# Patient Record
Sex: Female | Born: 1990 | Hispanic: Yes | Marital: Single | Smoking: Never smoker
Health system: Southern US, Community
[De-identification: ages and names within clinical notes are randomized; demographics above are authoritative.]

## PROBLEM LIST (undated history)

## (undated) DIAGNOSIS — E282 Polycystic ovarian syndrome: Secondary | ICD-10-CM

## (undated) HISTORY — DX: Polycystic ovarian syndrome: E28.2

---

## 2014-03-04 ENCOUNTER — Emergency Department (HOSPITAL_COMMUNITY)
Admission: EM | Admit: 2014-03-04 | Discharge: 2014-03-05 | Disposition: A | Discharge status: Home / Self Care | Payer: No Typology Code available for payment source | Attending: Emergency Medicine | Admitting: Emergency Medicine

## 2014-03-04 ENCOUNTER — Encounter (HOSPITAL_COMMUNITY): Payer: Self-pay | Admitting: Emergency Medicine

## 2014-03-04 ENCOUNTER — Emergency Department (HOSPITAL_COMMUNITY): Payer: No Typology Code available for payment source

## 2014-03-04 DIAGNOSIS — S9000XA Contusion of unspecified ankle, initial encounter: Secondary | ICD-10-CM | POA: Insufficient documentation

## 2014-03-04 DIAGNOSIS — S9031XA Contusion of right foot, initial encounter: Secondary | ICD-10-CM

## 2014-03-04 DIAGNOSIS — Y9389 Activity, other specified: Secondary | ICD-10-CM | POA: Insufficient documentation

## 2014-03-04 DIAGNOSIS — S99919A Unspecified injury of unspecified ankle, initial encounter: Secondary | ICD-10-CM

## 2014-03-04 DIAGNOSIS — S93401A Sprain of unspecified ligament of right ankle, initial encounter: Secondary | ICD-10-CM

## 2014-03-04 DIAGNOSIS — S8990XA Unspecified injury of unspecified lower leg, initial encounter: Secondary | ICD-10-CM | POA: Insufficient documentation

## 2014-03-04 DIAGNOSIS — Y92410 Unspecified street and highway as the place of occurrence of the external cause: Secondary | ICD-10-CM | POA: Insufficient documentation

## 2014-03-04 DIAGNOSIS — S9030XA Contusion of unspecified foot, initial encounter: Secondary | ICD-10-CM | POA: Insufficient documentation

## 2014-03-04 DIAGNOSIS — S99929A Unspecified injury of unspecified foot, initial encounter: Secondary | ICD-10-CM

## 2014-03-04 DIAGNOSIS — S93409A Sprain of unspecified ligament of unspecified ankle, initial encounter: Secondary | ICD-10-CM | POA: Insufficient documentation

## 2014-03-04 MED ORDER — IBUPROFEN 400 MG PO TABS
400.0000 mg | ORAL_TABLET | Freq: Once | ORAL | Status: AC
  Administered 2014-03-04: 400 mg via ORAL
  Filled 2014-03-04: qty 1
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
## 2014-03-05 MED ORDER — IBUPROFEN 800 MG PO TABS: 800.0000 mg | ORAL_TABLET | Freq: Three times a day (TID) | ORAL | Status: DC

## 2014-03-05 NOTE — Discharge Instructions (Signed)
1. Medications: ibuprofen, usual home medications 2. Treatment: rest, drink plenty of fluids, use ankle brace 3. Follow Up: Please followup with your primary doctor or orthopedics in 1 week for discussion of your diagnoses and further evaluation after today's visit; if you do not have a primary care doctor use the resource guide provided to find one;     Ankle Sprain An ankle sprain is an injury to the strong, fibrous tissues (ligaments) that hold the bones of your ankle joint together.  CAUSES An ankle sprain is usually caused by a fall or by twisting your ankle. Ankle sprains most commonly occur when you step on the outer edge of your foot, and your ankle turns inward. People who participate in sports are more prone to these types of injuries.  SYMPTOMS   Pain in your ankle. The pain may be present at rest or only when you are trying to stand or walk.  Swelling.  Bruising. Bruising may develop immediately or within 1 to 2 days after your injury.  Difficulty standing or walking, particularly when turning corners or changing directions. DIAGNOSIS  Your caregiver will ask you details about your injury and perform a physical exam of your ankle to determine if you have an ankle sprain. During the physical exam, your caregiver will press on and apply pressure to specific areas of your foot and ankle. Your caregiver will try to move your ankle in certain ways. An X-ray exam may be done to be sure a bone was not broken or a ligament did not separate from one of the bones in your ankle (avulsion fracture).  TREATMENT  Certain types of braces can help stabilize your ankle. Your caregiver can make a recommendation for this. Your caregiver may recommend the use of medicine for pain. If your sprain is severe, your caregiver may refer you to a surgeon who helps to restore function to parts of your skeletal system (orthopedist) or a physical therapist. HOME CARE INSTRUCTIONS   Apply ice to your injury  for 1-2 days or as directed by your caregiver. Applying ice helps to reduce inflammation and pain.  Put ice in a plastic bag.  Place a towel between your skin and the bag.  Leave the ice on for 15-20 minutes at a time, every 2 hours while you are awake.  Only take over-the-counter or prescription medicines for pain, discomfort, or fever as directed by your caregiver.  Elevate your injured ankle above the level of your heart as much as possible for 2-3 days.  If your caregiver recommends crutches, use them as instructed. Gradually put weight on the affected ankle. Continue to use crutches or a cane until you can walk without feeling pain in your ankle.  If you have a plaster splint, wear the splint as directed by your caregiver. Do not rest it on anything harder than a pillow for the first 24 hours. Do not put weight on it. Do not get it wet. You may take it off to take a shower or bath.  You may have been given an elastic bandage to wear around your ankle to provide support. If the elastic bandage is too tight (you have numbness or tingling in your foot or your foot becomes cold and blue), adjust the bandage to make it comfortable.  If you have an air splint, you may blow more air into it or let air out to make it more comfortable. You may take your splint off at night and before taking a  shower or bath. Wiggle your toes in the splint several times per day to decrease swelling. SEEK MEDICAL CARE IF:   You have rapidly increasing bruising or swelling.  Your toes feel extremely cold or you lose feeling in your foot.  Your pain is not relieved with medicine. SEEK IMMEDIATE MEDICAL CARE IF:  Your toes are numb or blue.  You have severe pain that is increasing. MAKE SURE YOU:   Understand these instructions.  Will watch your condition.  Will get help right away if you are not doing well or get worse. Document Released: 07/25/2005 Document Revised: 04/18/2012 Document Reviewed:  08/06/2011 [facility] Patient Information 2015 [place], [place]. This information is not intended to replace advice given to you by your health care provider. Make sure you discuss any questions you have with your health care provider.  Contusion A contusion is a deep bruise. Contusions are the result of an injury that caused bleeding under the skin. The contusion may turn blue, purple, or yellow. Minor injuries will give you a painless contusion, but more severe contusions may stay painful and swollen for a few weeks.  CAUSES  A contusion is usually caused by a blow, trauma, or direct force to an area of the body. SYMPTOMS   Swelling and redness of the injured area.  Bruising of the injured area.  Tenderness and soreness of the injured area.  Pain. DIAGNOSIS  The diagnosis can be made by taking a history and physical exam. An X-ray, CT scan, or MRI may be needed to determine if there were any associated injuries, such as fractures. TREATMENT  Specific treatment will depend on what area of the body was injured. In general, the best treatment for a contusion is resting, icing, elevating, and applying cold compresses to the injured area. Over-the-counter medicines may also be recommended for pain control. Ask your caregiver what the best treatment is for your contusion. HOME CARE INSTRUCTIONS   Put ice on the injured area.  Put ice in a plastic bag.  Place a towel between your skin and the bag.  Leave the ice on for 15-20 minutes, 3-4 times a day, or as directed by your health care provider.  Only take over-the-counter or prescription medicines for pain, discomfort, or fever as directed by your caregiver. Your caregiver may recommend avoiding anti-inflammatory medicines (aspirin, ibuprofen, and naproxen) for 48 hours because these medicines may increase bruising.  Rest the injured area.  If possible, elevate the injured area to reduce swelling. SEEK IMMEDIATE MEDICAL CARE IF:   You  have increased bruising or swelling.  You have pain that is getting worse.  Your swelling or pain is not relieved with medicines. MAKE SURE YOU:   Understand these instructions.  Will watch your condition.  Will get help right away if you are not doing well or get worse. Document Released: 05/04/2005 Document Revised: 07/30/2013 Document Reviewed: 05/30/2011 [facility] Patient Information 2015 [place], [place]. This information is not intended to replace advice given to you by your health care provider. Make sure you discuss any questions you have with your health care provider.    Emergency Department Resource Guide 1) Find a Doctor and Pay Out of Pocket Although you won't have to find out who is covered by your insurance plan, it is a good idea to ask around and get recommendations. You will then need to call the office and see if the doctor you have chosen will accept you as a new patient and what types of  options they offer for patients who are self-pay. Some doctors offer discounts or will set up payment plans for their patients who do not have insurance, but you will need to ask so you aren't surprised when you get to your appointment.  2) Contact Your Local Health Department Not all health departments have doctors that can see patients for sick visits, but many do, so it is worth a call to see if yours does. If you don't know where your local health department is, you can check in your phone book. The CDC also has a tool to help you locate your state's health department, and many state websites also have listings of all of their local health departments.  3) Find a Walk-in Clinic If your illness is not likely to be very severe or complicated, you may want to try a walk in clinic. These are popping up all over the country in pharmacies, drugstores, and shopping centers. They're usually staffed by nurse practitioners or physician assistants that have been trained to treat common illnesses  and complaints. They're usually fairly quick and inexpensive. However, if you have serious medical issues or chronic medical problems, these are probably not your best option.  No Primary Care Doctor: - Call Health Connect at  [phone] - they can help you locate a primary care doctor that  accepts your insurance, provides certain services, etc. - Physician Referral Service- [phone]  Chronic Pain Problems: Organization         Address  Phone   Notes  [facility]  [phone] Patients need to be referred by their primary care doctor.   Medication Assistance: Organization         Address  Phone   Notes  [facility] Medication [facility] [address] [place], [place] [address] [phone] --Must be a resident of [facility] [place] -- Must have NO insurance coverage whatsoever (no Medicaid/ Medicare, etc.) -- The pt. MUST have a primary care doctor that directs their care regularly and follows them in the community   MedAssist  [phone]   [employer]  [phone]    Agencies that provide inexpensive medical care: Organization         Address  Phone   Notes  [facility]  [phone]   [facility]    [phone]   [facility] [address] [place], [place] [address] [phone]   [facility] [place] [address], [place] [phone]   [facility]anned Parenthood    [phone]) [phone]   [facility]    [phone]   [facility]  [address], [place] Phone:  [phone]) [phone], Fax:  [phone]) [phone] Hours of Operation:  9 am - 6 pm, M-F.  Also accepts Medicaid/Medicare and self-pay.  [facility]  [address], [place] Phone: [phone], Fax: [phone]) [phone]. Hours of Operation:  8:30 am - 5:30 pm, M-F.  Also accepts Medicaid and self-pay.  [facility] [place] [address], [place] Phone: [phone]   [facility] [address], [place], [place] [phone], Ext. [phone] Mondays & Thursdays: 7-9 AM.  First 15 patients are seen on a first come, first serve basis.    Medicaid-accepting [facility] Providers:  Organization         Address  Phone   Notes  [facility] [address], [place] [phone] Also accepts self-pay patients.  [facility] [id number] [place], [place]  [phone]   [place], [address], [place] [phone]   [facility] [address], [place] [phone]   Lucianne Lei [address], [place]   [phone] Only accepts [place] Access [place] patients after they have their name applied to their card.   Self-Pay (no insurance) in [facility]:  Organization         Address  Phone   Notes  Sickle Cell Patients, [facility] [place] [phone]   [facility] [place] [phone]   [facility] [place]  [place], [place], [place] [phone]   [facility]/Dr. Osei-Bonsu  [address] [place] or [address], [place] [phone] Phone number for both [place] and [place] locations is the same.  [facility] [address] [place] [phone]   [facility] [address], [place] or [address] [phone]   [facility] [address], [place] [phone], phone; [phone], fax Sees patients 1st and 3rd Saturday of every month.  Must not qualify for public or private insurance (i.e. Medicaid, Medicare, [place] Health Choice, Veterans' Benefits)  Household income should be no more than 200% of the poverty level  The clinic cannot treat you if you are pregnant or think you are pregnant  Sexually transmitted diseases are not treated at the clinic.    Dental Care: Organization         Address  Phone  Notes  [facility] of [facility] [phone] Accepts children up to age 92 who are enrolled in [place] or [place]; pregnant women with a Medicaid card; and children who have applied for Medicaid or [place] Health Choice, but were declined, whose parents can pay a reduced fee at time of service.  [facility] of [facility]  [address], [place] [phone] Accepts children up to age 72 who are enrolled in [place] or [place]; pregnant women with a Medicaid card; and children who have applied for Medicaid or [place] Health Choice, but were declined, whose parents can pay a reduced fee at time of service.  [place] [facility] Adult [facility] Access PROGRAM  [place] [phone] Patients are seen by appointment only. Walk-ins are not accepted. [place] will see patients 5 years of age and older. Monday - Tuesday (8am-5pm) Most Wednesdays (8:30-5pm) $30 per visit, cash only  [facility] Adult Dental Access PROGRAM  [address], [facility] [phone] Patients are seen by appointment only. Walk-ins are not accepted. [place] will see patients 15 years of age and older. One Wednesday Evening (Monthly: Volunteer Based).  $30 per visit, cash only  [place]  [phone] for adults; Children under age 21, call Graduate Pediatric Dentistry at [phone]. Children aged 70-14, please call [phone] to request a pediatric application.  Dental services are provided in all areas of dental care including fillings, crowns and bridges, complete and partial dentures,  implants, gum treatment, root canals, and extractions. Preventive care is  also provided. Treatment is provided to both adults and children. Patients are selected via a lottery and there is often a waiting list.   [facility] [address], [place]  [phone] [email or web address]   [facility] [address] [place], [place] [phone], Ext. [phone] Second and Fourth Thursday of each month, opens at 6:30 AM; Clinic ends at 9 AM.  Patients are seen on a first-come first-served basis, and a limited number are seen during each clinic.   [facility]  [address] Hillard Danker [place], [place] [phone]   Eligibility Requirements You must have lived in [place], [place], or [place] counties for at least the last three months.   You cannot be eligible for state or federal sponsored [employer], including [employer], [place], or [employer].   You generally cannot be eligible for healthcare insurance through your employer.    How to apply: Eligibility screenings are held every Tuesday and Wednesday afternoon from 1:00 pm until 4:00 pm. You do not need an appointment for the interview!  [facility] [address] [place], [place]   [place]  [place] Department  [place]  [phone]    Behavioral Health Resources in the Community: Intensive Outpatient Programs Organization         Address  Phone  Notes  [place]. [address] [place], [place] [phone]   [facility] Outpatient [address] [place], [place]   ADS: Alcohol [facility] Drug S[facility]cs [address] [place], [place]   [place] [address]  [place], [place] or [phone]   Substance Abuse Resources Organization         Address  Phone  Notes  Alcohol and Drug Services  [phone]   [place]  [phone]   The [place]   Chinita Pester  [phone]   Residential & Outpatient Substance Abuse Program  [phone]   Psychological Services Organization         Address  Phone  Notes  [facility] [place]  [place]  [phone]   [place] [address] [place] or [phone]    Mobile Crisis Teams Organization         Address  Phone  Notes  [facility], Mobile Crisis Care Unit  [phone]   [facility] Psychotherapeutic Services  [address] [place], [place]   [place] Levels [address] [place] [phone]    Self-Help/Support Groups Organization         Address  Phone             Notes  [place]. of [place] - variety of support groups  [place] Call for more information  Narcotics Anonymous (NA), [facility] [address], [employer]  2 meetings at this location   Special educational needs teacher         Address  Phone  Notes  ASAP Residential Treatment [place],    [place]  [phone]   [facility]  [address] [place] [phone], [place], [place]   [place], [place] [phone] Admissions: 8am-3pm M-F  Incentives  Substance [place] [address],    [place], [place] [phone]   [facility] [address] [place], [place], [place]   [facility] [address],  [place], [place]   [facility] Programs - Intensive Outpatient [address], [place], [place]   [facility] ([place].,  [place], [place] [phone] or [phone]   [facility] Services (RTS) [address], [place], [place] Accepts Medicaid  [facility] [place] [address],  [place] [phone]  Substance Abuse/Addiction Treatment   [facility] Organization         Address  Phone  Notes  [facility]  [phone]   Domenic Schwab, PhD [address] Arlis Porta [place], [place]   [phone] or [phone]   [place] Angelina [place], [place] [phone]   [facility] [address], [place], [place] [phone] Insurance/Medicaid/sponsorship through [facility] and Families [address] [place]                                    [place], [place] [phone] [place] [address], [place] [phone]    Dr. Adele Schilder  [phone]   [facility] Dept. 1) [address], [place] 2) [place] 3)  [place], [place] [phone]  [phone]   [place] [phone] or [phone] (After Hours)
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
## 2014-03-05 NOTE — ED Provider Notes (Signed)
Medical screening examination/treatment/procedure(s) were performed by non-physician practitioner and as supervising physician I was immediately available for consultation/collaboration.   EKG Interpretation None       Leeasia Secrist K Charlesetta Milliron-Rasch, MD 03/05/14 0214 

## 2014-07-05 ENCOUNTER — Encounter (HOSPITAL_COMMUNITY): Payer: Self-pay | Admitting: *Deleted

## 2014-07-05 ENCOUNTER — Emergency Department (HOSPITAL_COMMUNITY)
Admission: EM | Admit: 2014-07-05 | Discharge: 2014-07-05 | Discharge status: Against Medical Advice | Payer: No Typology Code available for payment source | Attending: Emergency Medicine | Admitting: Emergency Medicine

## 2014-07-05 DIAGNOSIS — R51 Headache: Secondary | ICD-10-CM | POA: Insufficient documentation

## 2014-07-05 NOTE — ED Notes (Signed)
Pt in c/o headache for the last four days, states pain wasn't bad the first few days but last night it increased, pt vomited x1 today, history of headaches but they do not normally last this long, alert and oriented, no distress noted

## 2015-05-11 ENCOUNTER — Encounter: Payer: Self-pay | Admitting: Family Medicine

## 2015-05-11 ENCOUNTER — Ambulatory Visit (INDEPENDENT_AMBULATORY_CARE_PROVIDER_SITE_OTHER): Payer: Managed Care, Other (non HMO) | Admitting: Family Medicine

## 2015-05-11 VITALS — BP 132/88 | HR 105 | Temp 98.4°F | Resp 16 | Ht 62.0 in | Wt 308.0 lb

## 2015-05-11 DIAGNOSIS — Z7189 Other specified counseling: Secondary | ICD-10-CM

## 2015-05-11 DIAGNOSIS — L0293 Carbuncle, unspecified: Secondary | ICD-10-CM | POA: Diagnosis not present

## 2015-05-11 DIAGNOSIS — Z23 Encounter for immunization: Secondary | ICD-10-CM | POA: Diagnosis not present

## 2015-05-11 DIAGNOSIS — Z7689 Persons encountering health services in other specified circumstances: Secondary | ICD-10-CM

## 2015-05-11 LAB — COMPLETE METABOLIC PANEL WITH GFR
ALT: 13 U/L (ref 6–29)
AST: 11 U/L (ref 10–30)
Albumin: 3.8 g/dL (ref 3.6–5.1)
Alkaline Phosphatase: 86 U/L (ref 33–115)
BUN: 10 mg/dL (ref 7–25)
CO2: 26 mmol/L (ref 20–31)
Calcium: 9.2 mg/dL (ref 8.6–10.2)
Chloride: 102 mmol/L (ref 98–110)
Creat: 0.55 mg/dL (ref 0.50–1.10)
GFR, Est African American: >89 mL/min (ref >=60)
GLUCOSE: 92 mg/dL (ref 65–99)
POTASSIUM: 4.3 mmol/L (ref 3.5–5.3)
Sodium: 136 mmol/L (ref 135–146)
Total Bilirubin: 0.2 mg/dL (ref 0.2–1.2)
Total Protein: 7.8 g/dL (ref 6.1–8.1)

## 2015-05-11 LAB — CBC WITH DIFFERENTIAL/PLATELET
Basophils Absolute: 0 10*3/uL (ref 0.0–0.1)
Basophils Relative: 0 % (ref 0–1)
Eosinophils Absolute: 0.2 10*3/uL (ref 0.0–0.7)
Eosinophils Relative: 2 % (ref 0–5)
HCT: 35.6 % — ABNORMAL LOW (ref 36.0–46.0)
HEMOGLOBIN: 11.8 g/dL — AB (ref 12.0–15.0)
LYMPHS ABS: 2.4 10*3/uL (ref 0.7–4.0)
Lymphocytes Relative: 31 % (ref 12–46)
MCH: 25.9 pg — AB (ref 26.0–34.0)
MCHC: 33.1 g/dL (ref 30.0–36.0)
MCV: 78.2 fL (ref 78.0–100.0)
MPV: 9.3 fL (ref 8.6–12.4)
Monocytes Absolute: 0.4 10*3/uL (ref 0.1–1.0)
Monocytes Relative: 5 % (ref 3–12)
NEUTROS PCT: 62 % (ref 43–77)
Neutro Abs: 4.8 10*3/uL (ref 1.7–7.7)
Platelets: 361 10*3/uL (ref 150–400)
RBC: 4.55 MIL/uL (ref 3.87–5.11)
RDW: 14.8 % (ref 11.5–15.5)
WBC: 7.8 10*3/uL (ref 4.0–10.5)

## 2015-05-11 LAB — TSH: TSH: 2.331 u[IU]/mL (ref 0.350–4.500)

## 2015-05-11 MED ORDER — SULFAMETHOXAZOLE-TRIMETHOPRIM 800-160 MG PO TABS: 1.0000 | ORAL_TABLET | Freq: Two times a day (BID) | ORAL | Status: DC

## 2015-05-11 NOTE — Patient Instructions (Signed)

## 2015-05-11 NOTE — Progress Notes (Signed)
Patient ID: Carolyn Gay, female   DOB: 07-05-1991, 24 y.o.   MRN: [id number]   Carolyn Gay, is a 24 y.o. female  WUJ:[id number]  NFA:[id number]  DOB - August 17, 1990  CC:  Chief Complaint  Patient presents with  . Establish Care    problems with abcess under arms and on bra line        HPI: Carolyn Gay is a 24 y.o. female here to establish care. Her medical history is unremarkable except for a history of PCOS, frequent boils in axilla and morbid obesity. She is on no regular medications at this time. She does a request a prescription for an antibiotic to have on hand in case of abscess formation. She is in need of a Tdap, influenza and a PAP smear. She does complaint today of a two week history of upper respiratory congestion and cough, which is improving.  She denies, tobacco, alcohol or illicit drug use. She admits to not following a particularly healthy diet and does not exercise regularly.   No Known Allergies Past Medical History  Diagnosis Date  . PCOS (polycystic ovarian syndrome)     at age 73    Current Outpatient Prescriptions on File Prior to Visit  Medication Sig Dispense Refill  . ibuprofen (ADVIL,MOTRIN) 800 MG tablet Take 1 tablet (800 mg total) by mouth 3 (three) times daily. 21 tablet 0   No current facility-administered medications on file prior to visit.   Family History  Problem Relation Age of Onset  . Diabetes Mother   . Cancer Maternal Grandmother     breast   Social History   Social History  . Marital Status: Single    Spouse Name: N/A  . Number of Children: N/A  . Years of Education: N/A   Occupational History  . Not on file.   Social History Main Topics  . Smoking status: Never Smoker   . Smokeless tobacco: Never Used  . Alcohol Use: No  . Drug Use: No  . Sexual Activity: No   Other Topics Concern  . Not on file   Social History Narrative    Review of Systems: Constitutional: Negative for fever, chills, appetite change, weight loss,   Fatigue. Skin: Negative for rashes or lesions of concern.Positive for frequent boils in axilla, and trunk. None present now. HENT: Negative for ear pain, ear discharge.nose bleeds. Has had recent nasal congestion which is improving Eyes: Negative for pain, discharge, redness, itching and visual disturbance. Neck: Negative for pain, stiffness Respiratory: Negative for shortness of breath. Positive for recent cough which is improving. Cardiovascular: Negative for chest pain, palpitations and leg swelling. Gastrointestinal: Negative for abdominal pain, nausea, vomiting, diarrhea, constipations Genitourinary: Negative for dysuria, urgency, frequency, hematuria,  Musculoskeletal: Negative for back pain, joint pain, joint  swelling, and gait problem.Negative for weakness. Neurological: Negative for dizziness, tremors, seizures, syncope,   light-headedness, numbness and headaches.  Hematological: Negative for easy bruising or bleeding Psychiatric/Behavioral: Negative for depression, anxiety, decreased concentration, confusion GYN: Denies current problems with periods. Is not sexually active and has had sex once about 2 years ago.    Objective:   Filed Vitals:   05/11/15 1128  BP: 132/88  Pulse:   Temp:   Resp:     Physical Exam: Constitutional: Patient appears well-developed and well-nourished. No distress.. Morbidly obese. HENT: Normocephalic, atraumatic, External right and left ear normal. Oropharynx is clear and moist.  Eyes: Conjunctivae and EOM are normal. PERRLA, no scleral icterus. Neck: Normal ROM. Neck supple. No  lymphadenopathy, No thyromegaly. CVS: RRR, S1/S2 +, no murmurs, no gallops, no rubs Pulmonary: Effort and breath sounds normal, no stridor, rhonchi, wheezes, rales.  Abdominal: Soft. Normoactive BS,, no distension, tenderness, rebound or guarding. Obese. Musculoskeletal: Normal range of motion. No edema and no tenderness.  Neuro: Alert.Normal muscle tone coordination.  Non-focal Skin: Skin is warm and dry. No rash noted. Not diaphoretic. No erythema. No pallor.There are multiple scars in the axilla from previous abscesses.  Psychiatric: Normal mood and affect. Behavior, judgment, thought content normal.  No results found for: WBC, HGB, HCT, MCV, PLT No results found for: CREATININE, BUN, NA, K, CL, CO2  No results found for: HGBA1C Lipid Panel  No results found for: CHOL, TRIG, HDL, CHOLHDL, VLDL, LDLCALC     Assessment and plan:   1. Encounter to establish care - I have reviewed information provided by the patient  - COMPLETE METABOLIC PANEL WITH GFR - CBC with Differential - TSH - HIV antibody (with reflex)  2.Need for prophylactic vaccination and inoculation against influenza  - Flu Vaccine QUAD 36+ mos PF IM (Fluarix & Fluzone Quad PF)  3. Need for Tdap vaccination - Tdap  4. Morbid obesity, unspecified obesity type (HCC) - Discussed the many reasons to attempt to lose weight and provided printed information  5. 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 0 refills      Return in about 2 months (around 07/11/2015). for PAP smear.  The patient was given clear instructions to go to ER or return to medical center if symptoms don't improve, worsen or new problems develop. The patient verbalized understanding.    Henrietta Hoover FNP  05/11/2015, 11:34 AM

## 2015-05-12 LAB — HIV ANTIBODY (ROUTINE TESTING W REFLEX): HIV 1&2 Ab, 4th Generation: NONREACTIVE

## 2015-06-08 ENCOUNTER — Ambulatory Visit (INDEPENDENT_AMBULATORY_CARE_PROVIDER_SITE_OTHER): Payer: Managed Care, Other (non HMO) | Admitting: Family Medicine

## 2015-06-08 VITALS — BP 119/86 | HR 119 | Temp 98.4°F | Resp 16 | Ht 62.0 in | Wt 307.0 lb

## 2015-06-08 DIAGNOSIS — M25569 Pain in unspecified knee: Secondary | ICD-10-CM | POA: Diagnosis not present

## 2015-06-08 NOTE — Patient Instructions (Signed)
Generic Knee Exercises EXERCISES RANGE OF MOTION (ROM) AND STRETCHING EXERCISES These exercises may help you when beginning to rehabilitate your injury. Your symptoms may resolve with or without further involvement from your physician, physical therapist, or athletic trainer. While completing these exercises, remember:   Restoring tissue flexibility helps normal motion to return to the joints. This allows healthier, less painful movement and activity.  An effective stretch should be held for at least 30 seconds.  A stretch should never be painful. You should only feel a gentle lengthening or release in the stretched tissue. STRETCH - Knee Extension, Prone  Lie on your stomach on a firm surface, such as a bed or countertop. Place your right / left knee and leg just beyond the edge of the surface. You may wish to place a towel under the far end of your right / left thigh for comfort.  Relax your leg muscles and allow gravity to straighten your knee. Your clinician may advise you to add an ankle weight if more resistance is helpful for you.  You should feel a stretch in the back of your right / left knee. Hold this position for ____30______ seconds. Repeat ____10______ times. Complete this stretch ____10______ times per day. * Your physician, physical therapist, or athletic trainer may ask you to add ankle weight to enhance your stretch.  RANGE OF MOTION - Knee Flexion, Active  Lie on your back with both knees straight. (If this causes back discomfort, bend your opposite knee, placing your foot flat on the floor.)  Slowly slide your heel back toward your buttocks until you feel a gentle stretch in the front of your knee or thigh.  Hold for ____30______ seconds. Slowly slide your heel back to the starting position. Repeat __________ times. Complete this exercise __________ times per day.  STRETCH - Quadriceps, Prone   Lie on your stomach on a firm surface, such as a bed or padded  floor.  Bend your right / left knee and grasp your ankle. If you are unable to reach your ankle or pant leg, use a belt around your foot to lengthen your reach.  Gently pull your heel toward your buttocks. Your knee should not slide out to the side. You should feel a stretch in the front of your thigh and/or knee.  Hold this position for __________ seconds. Repeat __________ times. Complete this stretch __________ times per day.  STRETCH - Hamstrings, Supine   Lie on your back. Loop a belt or towel over the ball of your right / left foot.  Straighten your right / left knee and slowly pull on the belt to raise your leg. Do not allow the right / left knee to bend. Keep your opposite leg flat on the floor.  Raise the leg until you feel a gentle stretch behind your right / left knee or thigh. Hold this position for __________ seconds. Repeat __________ times. Complete this stretch __________ times per day.  STRENGTHENING EXERCISES These exercises may help you when beginning to rehabilitate your injury. They may resolve your symptoms with or without further involvement from your physician, physical therapist, or athletic trainer. While completing these exercises, remember:   Muscles can gain both the endurance and the strength needed for everyday activities through controlled exercises.  Complete these exercises as instructed by your physician, physical therapist, or athletic trainer. Progress the resistance and repetitions only as guided.  You may experience muscle soreness or fatigue, but the pain or discomfort you are trying to  eliminate should never worsen during these exercises. If this pain does worsen, stop and make certain you are following the directions exactly. If the pain is still present after adjustments, discontinue the exercise until you can discuss the trouble with your clinician. STRENGTH - Quadriceps, Isometrics  Lie on your back with your right / left leg extended and your  opposite knee bent.  Gradually tense the muscles in the front of your right / left thigh. You should see either your knee cap slide up toward your hip or increased dimpling just above the knee. This motion will push the back of the knee down toward the floor/mat/bed on which you are lying.  Hold the muscle as tight as you can without increasing your pain for __________ seconds.  Relax the muscles slowly and completely in between each repetition. Repeat __________ times. Complete this exercise __________ times per day.  STRENGTH - Quadriceps, Short Arcs   Lie on your back. Place a __________ inch towel roll under your knee so that the knee slightly bends.  Raise only your lower leg by tightening the muscles in the front of your thigh. Do not allow your thigh to rise.  Hold this position for __________ seconds. Repeat __________ times. Complete this exercise __________ times per day.  OPTIONAL ANKLE WEIGHTS: Begin with ____________________, but DO NOT exceed ____________________. Increase in 1 pound/0.5 kilogram increments.  STRENGTH - Quadriceps, Straight Leg Raises  Quality counts! Watch for signs that the quadriceps muscle is working to insure you are strengthening the correct muscles and not "cheating" by substituting with healthier muscles.  Lay on your back with your right / left leg extended and your opposite knee bent.  Tense the muscles in the front of your right / left thigh. You should see either your knee cap slide up or increased dimpling just above the knee. Your thigh may even quiver.  Tighten these muscles even more and raise your leg 4 to 6 inches off the floor. Hold for __________ seconds.  Keeping these muscles tense, lower your leg.  Relax the muscles slowly and completely in between each repetition. Repeat __________ times. Complete this exercise __________ times per day.  STRENGTH - Hamstring, Curls  Lay on your stomach with your legs extended. (If you lay on a  bed, your feet may hang over the edge.)  Tighten the muscles in the back of your thigh to bend your right / left knee up to 90 degrees. Keep your hips flat on the bed/floor.  Hold this position for __________ seconds.  Slowly lower your leg back to the starting position. Repeat __________ times. Complete this exercise __________ times per day.  OPTIONAL ANKLE WEIGHTS: Begin with ____________________, but DO NOT exceed ____________________. Increase in 1 pound/0.5 kilogram increments.  STRENGTH - Quadriceps, Squats  Stand in a door frame so that your feet and knees are in line with the frame.  Use your hands for balance, not support, on the frame.  Slowly lower your weight, bending at the hips and knees. Keep your lower legs upright so that they are parallel with the door frame. Squat only within the range that does not increase your knee pain. Never let your hips drop below your knees.  Slowly return upright, pushing with your legs, not pulling with your hands. Repeat __________ times. Complete this exercise __________ times per day.  STRENGTH - Quadriceps, Wall Slides  Follow guidelines for form closely. Increased knee pain often results from poorly placed feet or knees.  Lean against a smooth wall or door and walk your feet out 18-24 inches. Place your feet hip-width apart.  Slowly slide down the wall or door until your knees bend __________ degrees.* Keep your knees over your heels, not your toes, and in line with your hips, not falling to either side.  Hold for __________ seconds. Stand up to rest for __________ seconds in between each repetition. Repeat __________ times. Complete this exercise __________ times per day. * Your physician, physical therapist, or athletic trainer will alter this angle based on your symptoms and progress.  Apply heat to your knee for 20 minutes every nigh. You may use ibuprofen if needed for pain. This information is not intended to replace advice  given to you by your health care provider. Make sure you discuss any questions you have with your health care provider.   Document Released: 06/08/2005 Document Revised: 08/15/2014 Document Reviewed: 11/06/2008 [employer] Interactive Patient Education [employer]

## 2015-06-09 NOTE — Progress Notes (Signed)
Patient ID: Carolyn Gay, female   DOB: 07/21/1991, 24 y.o.   MRN: [id number]   Carolyn Gay, is a 24 y.o. female  [id number]    DOB - 04/12/1991  CC:  Chief Complaint  Patient presents with  . Knee Pain    right knee ---wants note for [employer]cool         HPI: Carolyn Gay is a [age] year/old female who presents requesting a note for school to allow her to exercise in a way that does not hurt her knee, mostly kneeling. She twisted her knee a couple of weeks ago and is better. Is walking and bending her knee without problem but there is significant pain if she has to kneel on the gym floor to assist someone else with situps or to do pushups.  She denies anyone complaints today.  She denies chest pain, shortness of breath, dizziness, headaches. She denies gait difficulty. She recognizes that her weight is very much aggravating this problem        No Known Allergies  Past Medical History  Diagnosis Date  . PCOS (polycystic ovarian syndrome)     at age 24    Dewayne's family history includes Cancer in her maternal grandmother; Diabetes in her mother.  Social History   Social History  . Marital Status: Single    Spouse Name: N/A  . Number of Children: N/A  . Years of Education: N/A   Occupational History  . Not on file.   Social History Main Topics  . Smoking status: Never Smoker   . Smokeless tobacco: Never Used  . Alcohol Use: No  . Drug Use: No  . Sexual Activity: No   Other Topics Concern  . Not on file   Social History Narrative   No past surgical history on file.     ROS:  GEN:   Denies fever, chills,WT loss, loss of appetite Skin:   Denies lesions or rashes HENT:   Denies  earache, epistaxis, sore throat, or neck pain, or headaches EYES:   Denies eye pain or drainage.               LUNGS:  Denies SOB with rest or walking; couging, choking CV:   Denies CP or palpitations ABD:   Denies abdominal pain, nausea,and vomiting, diarrhea  GU:   Denies frequency, urgency,  dysuria          EXT:    Denies muscle spasms or swelling. Positive for pain right knee,no unilateral weakness  NEURO:   Denies numbness or tingling, denies seizures  Objective:  Filed Vitals:   06/08/15 1402  BP: 119/86  Pulse: 119  Temp: 98.4 F (36.9 C)  TempSrc: Oral  Resp: 16  Height: 5\' 2"  (1.575 m)  Weight: 307 lb (139.254 kg)    Physical Exam:  General:   Well-developed, well-nourished, in no acute distress Skin:      Warm and dry, no significant rashes.     Head :    Normocephalic, atraumatic, no pallor, Eyes:      No icterus, drainage, PERL, EOMS intact Ears:      Clear bilaterally, TMs within normal limits. Nose:   nares patent w/o significant congestion or inflammation   Neck:      Supple FROM w/o adenopathy, tenderness, or thyroidomegaly No JVD  or tracheal diviation Heart:     Normal  RRR,without M,G,R Lungs:    Clear to auscultation bilaterally. No increase in respiratory effort.No wheezes, rales or stridor Abdomen:  Soft, nontender, nondistended, positive bowel sounds, no guarding or   rebound Exetremeties:  No pedal edema.FROM, 2+pedal pulses, No unilateral weakness.   Neuro:   Alert, oriented, appropriate, non-focal.  Pertinent Lab Results:  Lab Results  Component Value Date   WBC 7.8 05/11/2015   HGB 11.8* 05/11/2015   HCT 35.6* 05/11/2015   MCV 78.2 05/11/2015   PLT 361 05/11/2015     Chemistry      Component Value Date/Time   NA 136 05/11/2015 1129   K 4.3 05/11/2015 1129   CL 102 05/11/2015 1129   CO2 26 05/11/2015 1129   BUN 10 05/11/2015 1129   CREATININE 0.55 05/11/2015 1129      Component Value Date/Time   CALCIUM 9.2 05/11/2015 1129   ALKPHOS 86 05/11/2015 1129   AST 11 05/11/2015 1129   ALT 13 05/11/2015 1129   BILITOT 0.2 05/11/2015 1129      No results found for: HGBA1C  Medications: Prior to Admission medications   Medication Sig Start Date End Date Taking? Authorizing Provider  ibuprofen (ADVIL,MOTRIN) 800 MG tablet  Take 1 tablet (800 mg total) by mouth 3 (three) times daily. 03/05/14  Yes Hannah Muthersbaugh, PA-C  sulfamethoxazole-trimethoprim (BACTRIM DS,SEPTRA DS) 800-160 MG tablet Take 1 tablet by mouth 2 (two) times daily. Patient not taking: Reported on 06/08/2015 05/11/15   Henrietta Hoover, NP    Assessment/Plan  1. Knee pain -I have provided a note to gym teacher to allow her to modify her exercise regime to decrease stress on her knee -I have provided written knee exercise sheet to strengthen knee. -I have advised her to consider weight loss.  Follow up:PRN  The patient was given clear instructions to go to ER or return to medical center if symptoms don't improve, worsen or new problems develop. The patient verbalized understanding.     Henrietta Hoover, FNP,BC 06/09/2015, 11:00 AM

## 2015-07-13 ENCOUNTER — Ambulatory Visit: Payer: Managed Care, Other (non HMO) | Admitting: Family Medicine

## 2015-07-29 ENCOUNTER — Ambulatory Visit (INDEPENDENT_AMBULATORY_CARE_PROVIDER_SITE_OTHER): Payer: Managed Care, Other (non HMO) | Admitting: Family Medicine

## 2015-07-29 ENCOUNTER — Other Ambulatory Visit (HOSPITAL_COMMUNITY): Payer: Self-pay | Admitting: Family Medicine

## 2015-07-29 ENCOUNTER — Other Ambulatory Visit (HOSPITAL_COMMUNITY)
Admission: RE | Admit: 2015-07-29 | Discharge: 2015-07-29 | Disposition: A | Discharge status: Home / Self Care | Payer: Managed Care, Other (non HMO) | Source: Ambulatory Visit | Attending: Internal Medicine | Admitting: Internal Medicine

## 2015-07-29 VITALS — BP 126/66 | HR 83 | Temp 98.1°F | Resp 16 | Ht 62.0 in | Wt 304.0 lb

## 2015-07-29 DIAGNOSIS — Z124 Encounter for screening for malignant neoplasm of cervix: Secondary | ICD-10-CM

## 2015-07-29 DIAGNOSIS — Z01419 Encounter for gynecological examination (general) (routine) without abnormal findings: Secondary | ICD-10-CM | POA: Diagnosis present

## 2015-07-29 NOTE — Progress Notes (Signed)
Patient ID: Carolyn Gay, female   DOB: 10/14/1990, 24 y.o.   MRN: [id number]   Carolyn Gay 24 y/o female presents for collection of PAP smear. Other health maintenance issues were address at previous villages and she presents for collection of PAP smear not done at that visit.  She denies any other issues today.  Patient is morbidly obese, the vaginal canal is very deep and cervix was very difficult to visualize and specimen hard to collect.  I am not sure with 100 percent certainly that a cervical os specimen was collected. Unable to adequately perform a bimanual exam due to body habitus.

## 2015-07-30 LAB — CYTOLOGY - PAP

## 2016-01-01 ENCOUNTER — Ambulatory Visit (INDEPENDENT_AMBULATORY_CARE_PROVIDER_SITE_OTHER): Payer: Managed Care, Other (non HMO) | Admitting: Family Medicine

## 2016-01-01 VITALS — BP 132/80 | HR 82 | Temp 98.1°F | Resp 16 | Ht 62.0 in | Wt 295.0 lb

## 2016-01-01 DIAGNOSIS — B353 Tinea pedis: Secondary | ICD-10-CM | POA: Diagnosis not present

## 2016-01-01 MED ORDER — CLOTRIMAZOLE-BETAMETHASONE 1-0.05 % EX CREA: 1.0000 "application " | TOPICAL_CREAM | Freq: Two times a day (BID) | CUTANEOUS | Status: DC

## 2016-01-01 NOTE — Patient Instructions (Signed)
Use cream on feet twice a day for 2 weeks. Will put in referral to nutritionist if your insurance covers. Your thyroid function was normal in October.

## 2016-01-01 NOTE — Progress Notes (Signed)
Patient ID: Carolyn Gay, female   DOB: 09-26-1990, 25 y.o.   MRN: [id number]   Carolyn Gay, is a 25 y.o. female  [id number]  [id number]  DOB - 1991-02-10  CC:  Chief Complaint  Patient presents with  . Pruritis    inbetween toes . cracked skin on foot        HPI: Carolyn Gay is a 25 y.o. female with a itchy, scaley foot for a couple of months. She has used no OTC antifungals. She also complains of inability to lose weight, despite better diet and increased exercise.  No Known Allergies Past Medical History  Diagnosis Date  . PCOS (polycystic ovarian syndrome)     at age 4    Current Outpatient Prescriptions on File Prior to Visit  Medication Sig Dispense Refill  . ibuprofen (ADVIL,MOTRIN) 800 MG tablet Take 1 tablet (800 mg total) by mouth 3 (three) times daily. (Patient not taking: Reported on 07/29/2015) 21 tablet 0   No current facility-administered medications on file prior to visit.   Family History  Problem Relation Age of Onset  . Diabetes Mother   . Cancer Maternal Grandmother     breast   Social History   Social History  . Marital Status: Single    Spouse Name: N/A  . Number of Children: N/A  . Years of Education: N/A   Occupational History  . Not on file.   Social History Main Topics  . Smoking status: Never Smoker   . Smokeless tobacco: Never Used  . Alcohol Use: No  . Drug Use: No  . Sexual Activity: No   Other Topics Concern  . Not on file   Social History Narrative    Review of Systems: Constitutional: Negative for fever, chills, appetite change, weight loss,  Fatigue. Skin: Positive for itching, scaling left foot. HENT: Negative for ear pain, ear discharge.nose bleeds Eyes: Negative for pain, discharge, redness, itching and visual disturbance. Neck: Negative for pain, stiffness Respiratory: Negative for cough, shortness of breath,   Cardiovascular: Negative for chest pain, palpitations and leg swelling. Gastrointestinal: Negative for  abdominal pain, nausea, vomiting, diarrhea, constipations Genitourinary: Negative for dysuria, urgency, frequency, hematuria,  Musculoskeletal: Negative for back pain, joint pain, joint  swelling, and gait problem.Negative for weakness. Neurological: Negative for dizziness, tremors, seizures, syncope,   light-headedness, numbness and headaches.  Hematological: Negative for easy bruising or bleeding Psychiatric/Behavioral: Negative for depression, anxiety, decreased concentration, confusion   Objective:   Filed Vitals:   01/01/16 0805  BP: 132/80  Pulse: 82  Temp: 98.1 F (36.7 C)  Resp: 16    Physical Exam: Constitutional: Patient appears well-developed and well-nourished. No distress. HENT: Normocephalic, atraumatic, External right and left ear normal. Oropharynx is clear and moist.  Eyes: Conjunctivae and EOM are normal. PERRLA, no scleral icterus. Neck: Normal ROM. Neck supple. No lymphadenopathy, No thyromegaly. CVS: RRR, S1/S2 +, no murmurs, no gallops, no rubs Pulmonary: Effort and breath sounds normal, no stridor, rhonchi, wheezes, rales.  Abdominal: Soft. Normoactive BS,, no distension, tenderness, rebound or guarding.  Musculoskeletal: Normal range of motion. No edema and no tenderness.  Neuro: Alert.Normal muscle tone coordination. Non-focal Skin: Skin is warm and dry.Marland Kitchen No erythema. No pallor.Positive for some mild scaling of left foot. Psychiatric: Normal mood and affect. Behavior, judgment, thought content normal.  Lab Results  Component Value Date   WBC 7.8 05/11/2015   HGB 11.8* 05/11/2015   HCT 35.6* 05/11/2015   MCV 78.2 05/11/2015   PLT 361 05/11/2015  Lab Results  Component Value Date   CREATININE 0.55 05/11/2015   BUN 10 05/11/2015   NA 136 05/11/2015   K 4.3 05/11/2015   CL 102 05/11/2015   CO2 26 05/11/2015    No results found for: HGBA1C Lipid Panel  No results found for: CHOL, TRIG, HDL, CHOLHDL, VLDL, LDLCALC     Assessment and plan:    There are no diagnoses linked to this encounter.  Return if symptoms worsen or fail to improve.  The patient was given clear instructions to go to ER or return to medical center if symptoms don't improve, worsen or new problems develop. The patient verbalized understanding.    Carolyn HooverLinda C Lacosta Hargan FNP  01/01/2016, 8:23 AM

## 2016-09-22 ENCOUNTER — Ambulatory Visit (INDEPENDENT_AMBULATORY_CARE_PROVIDER_SITE_OTHER): Payer: Managed Care, Other (non HMO) | Admitting: Family Medicine

## 2016-09-22 ENCOUNTER — Encounter: Payer: Self-pay | Admitting: Family Medicine

## 2016-09-22 VITALS — BP 122/79 | Temp 98.9°F | Resp 16 | Ht 62.0 in | Wt 293.0 lb

## 2016-09-22 DIAGNOSIS — E8881 Metabolic syndrome: Secondary | ICD-10-CM | POA: Insufficient documentation

## 2016-09-22 DIAGNOSIS — L729 Follicular cyst of the skin and subcutaneous tissue, unspecified: Secondary | ICD-10-CM | POA: Diagnosis not present

## 2016-09-22 DIAGNOSIS — Z3009 Encounter for other general counseling and advice on contraception: Secondary | ICD-10-CM

## 2016-09-22 DIAGNOSIS — Z3041 Encounter for surveillance of contraceptive pills: Secondary | ICD-10-CM | POA: Diagnosis not present

## 2016-09-22 DIAGNOSIS — Z202 Contact with and (suspected) exposure to infections with a predominantly sexual mode of transmission: Secondary | ICD-10-CM

## 2016-09-22 LAB — COMPLETE METABOLIC PANEL WITH GFR
ALT: 15 U/L (ref 6–29)
AST: 18 U/L (ref 10–30)
Albumin: 3.7 g/dL (ref 3.6–5.1)
Alkaline Phosphatase: 84 U/L (ref 33–115)
BUN: 12 mg/dL (ref 7–25)
CHLORIDE: 103 mmol/L (ref 98–110)
CO2: 27 mmol/L (ref 20–31)
Calcium: 9.3 mg/dL (ref 8.6–10.2)
Creat: 0.68 mg/dL (ref 0.50–1.10)
GFR, Est African American: >89 mL/min (ref >=60)
Glucose, Bld: 81 mg/dL (ref 65–99)
POTASSIUM: 4.2 mmol/L (ref 3.5–5.3)
Sodium: 138 mmol/L (ref 135–146)
Total Bilirubin: 0.2 mg/dL (ref 0.2–1.2)
Total Protein: 7.8 g/dL (ref 6.1–8.1)

## 2016-09-22 LAB — POCT URINE PREGNANCY: PREG TEST UR: NEGATIVE

## 2016-09-22 LAB — POCT GLYCOSYLATED HEMOGLOBIN (HGB A1C): Hemoglobin A1C: 5.1

## 2016-09-22 LAB — HIV ANTIBODY (ROUTINE TESTING W REFLEX): HIV 1&2 Ab, 4th Generation: NONREACTIVE

## 2016-09-22 LAB — TSH: TSH: 1.46 m[IU]/L

## 2016-09-22 MED ORDER — NORETHIN-ETH ESTRAD-FE BIPHAS 1 MG-10 MCG / 10 MCG PO TABS
1.0000 | ORAL_TABLET | Freq: Every day | ORAL | 11 refills | Status: DC
Start: 2016-09-22 — End: 2016-11-16

## 2016-09-22 NOTE — Patient Instructions (Addendum)
Will start a trial of Lo Loestrin-oral contraceptive. Return in 3 months for follow up.  Recommend Inositol (b vitamin) daily for PCOS. Recommend a lowfat, low carbohydrate diet divided over 5-6 small meals, increase water intake to 6-8 glasses, and 150 minutes per week of cardiovascular exercise.   Will notify by phone with laboratory results. Diet for Metabolic Syndrome Metabolic syndrome is a disorder that includes at least three of these conditions:  Abdominal obesity.  Too much sugar in your blood.  High blood pressure.  Higher than normal amount of fat (lipids) in your blood.  Lower than normal level of "good" cholesterol (HDL). Following a healthy diet can help to keep metabolic syndrome under control. It can also help to prevent the development of conditions that are associated with metabolic syndrome, such as diabetes, heart disease, and stroke. Along with exercise, a healthy diet:  Helps to improve the way that the body uses insulin.  Promotes weight loss. A common goal for people with this condition is to lose at least 7 to 10 percent of their starting weight. What do I need to know about this diet?  Use the glycemic index (GI) to plan your meals. The index tells you how quickly a food will raise your blood sugar. Choose foods that have low GI values. These foods take a longer time to raise blood sugar.  Keep track of how many calories you take in. Eating the right amount of calories will help your achieve a healthy weight.  You may want to follow a Mediterranean diet. This diet includes lots of vegetables, lean meats or fish, whole grains, fruits, and healthy oils and fats. What foods can I eat? Grains  Stone-ground whole wheat. Pumpernickel bread. Whole-grain bread, crackers, tortillas, cereal, and pasta. Unsweetened oatmeal.Bulgur.Barley.Quinoa.Brown rice or wild rice. Vegetables  Lettuce. Spinach. Peas. Beets. Cauliflower. Cabbage. Broccoli. Carrots. Tomatoes.  Squash. Eggplant. Herbs. Peppers. Onions. Cucumbers. Brussels sprouts. Sweet potatoes. Yams. Beans. Lentils. Fruits  Berries. Apples. Oranges. Grapes. Mango. Pomegranate. Kiwi. Cherries. Meats and Other Protein Sources  Seafood and shellfish. Lean meats.Poultry. Tofu. Dairy  Low-fat or fat-free dairy products, such as milk, yogurt, and cheese. Beverages  Water. Low-fat milk. Milk alternatives, like soy milk or almond milk. Real fruit juice. Condiments  Low-sugar or sugar-free ketchup, barbecue sauce, and mayonnaise. Mustard. Relish. Fats and Oils  Avocado. Canola or olive oil. Nuts and nut butters.Seeds. The items listed above may not be a complete list of recommended foods or beverages. Contact your dietitian for more options.  What foods are not recommended? Red meat. Palm oil and coconut oil. Processed foods. Fried foods. Alcohol. Sweetened drinks, such as iced tea and soda. Sweets. Salty foods. The items listed above may not be a complete list of foods and beverages to avoid. Contact your dietitian for more information.  This information is not intended to replace advice given to you by your health care provider. Make sure you discuss any questions you have with your health care provider. Document Released: 12/09/2014 Document Revised: 12/04/2015 Document Reviewed: 08/06/2014 Elsevier Interactive Patient Education  2017 [employer] Hormonal Contraception Information Introduction Estrogen and progesterone (progestin) are hormones used in many forms of birth control (contraception). These two hormones make up most hormonal contraceptives. Hormonal contraceptives use either:  A combination of estrogen hormone and progesterone hormone in one of these forms:  Pill. Pills come in various combinations of active hormone pills and nonhormonal pills. Different combinations of pills may give you a period once a month, once  every 3 months, or no period at all. It is important to take the pills  the same time each day.  Patch. The patch is placed on the lower abdomen every week for 3 weeks. On the fourth week, the patch is not placed.  Vaginal ring. The ring is placed in the vagina and left there for 3 weeks. It is then removed for 1 week.  Progesterone alone in one of these forms:  Pill. Hormone pills are taken every day of the cycle.  Intrauterine device (IUD). The IUD is inserted during a menstrual period and removed or replaced every 5 years or sooner.  Implant. Plastic rods are placed under the skin of the upper arm. They are removed or replaced every 3 years or sooner.  Injection. The injection is given once every 90 days. Pregnancy can still occur with any of these hormonal contraceptive methods. If you have any suspicion that you might be pregnant, take a pregnancy test and talk to your health care provider. Estrogen and progesterone contraceptives Estrogen and progesterone contraceptives can prevent pregnancy by:  Stopping the release of an egg (ovulation).  Thickening the mucus of the cervix, making it difficult for sperm to enter the uterus.  Changing the lining of the uterus. This change makes it more difficult for an egg to implant. Progesterone contraceptives Progesterone-only contraceptives can prevent pregnancy by:  Blocking ovulation. This occurs in many women, but some women will continue to ovulate.  Preventing the entry of sperm into the uterus by keeping the cervical mucus thick and sticky.  Changing the lining of the uterus. This change makes it more difficult for an egg to implant. Side effects Talk to your health care provider about what side effects may affect you. If you develop persistent side effects or if the effects are severe, talk to your health care provider.  Estrogen. Side effects from estrogen occur more often in the first 2-3 months. They include:  Progesterone. Side effects of progesterone can vary. They include: Questions to  ask This information is not intended to replace advice given to you by your health care provider. Make sure you discuss any questions you have with your health care provider. Document Released: 08/14/2007 Document Revised: 04/27/2016 Document Reviewed: 01/06/2013  2017 Elsevier  Oral Contraception Use Oral contraceptive pills (OCPs) are medicines taken to prevent pregnancy. OCPs work by preventing the ovaries from releasing eggs. The hormones in OCPs also cause the cervical mucus to thicken, preventing the sperm from entering the uterus. The hormones also cause the uterine lining to become thin, not allowing a fertilized egg to attach to the inside of the uterus. OCPs are highly effective when taken exactly as prescribed. However, OCPs do not prevent sexually transmitted diseases (STDs). Safe sex practices, such as using condoms along with an OCP, can help prevent STDs. Before taking OCPs, you may have a physical exam and Pap test. Your health care provider may also order blood tests if necessary. Your health care provider will make sure you are a good candidate for oral contraception. Discuss with your health care provider the possible side effects of the OCP you may be prescribed. When starting an OCP, it can take 2 to 3 months for the body to adjust to the changes in hormone levels in your body. How to take oral contraceptive pills Your health care provider may advise you on how to start taking the first cycle of OCPs. Otherwise, you can:  Start on day 1 of your menstrual  period. You will not need any backup contraceptive protection with this start time.  Start on the first Sunday after your menstrual period or the day you get your prescription. In these cases, you will need to use backup contraceptive protection for the first week.  Start the pill at any time of your cycle. If you take the pill within 5 days of the start of your period, you are protected against pregnancy right away. In this  case, you will not need a backup form of birth control. If you start at any other time of your menstrual cycle, you will need to use another form of birth control for 7 days. If your OCP is the type called a minipill, it will protect you from pregnancy after taking it for 2 days (48 hours). After you have started taking OCPs:  If you forget to take 1 pill, take it as soon as you remember. Take the next pill at the regular time.  If you miss 2 or more pills, call your health care provider because different pills have different instructions for missed doses. Use backup birth control until your next menstrual period starts.  If you use a 28-day pack that contains inactive pills and you miss 1 of the last 7 pills (pills with no hormones), it will not matter. Throw away the rest of the non-hormone pills and start a new pill pack. No matter which day you start the OCP, you will always start a new pack on that same day of the week. Have an extra pack of OCPs and a backup contraceptive method available in case you miss some pills or lose your OCP pack. Follow these instructions at home:  Do not smoke.  Always use a condom to protect against STDs. OCPs do not protect against STDs.  Use a calendar to mark your menstrual period days.  Read the information and directions that came with your OCP. Talk to your health care provider if you have questions. Contact a health care provider if:  You develop nausea and vomiting.  You have abnormal vaginal discharge or bleeding.  You develop a rash.  You miss your menstrual period.  You are losing your hair.  You need treatment for mood swings or depression.  You get dizzy when taking the OCP.  You develop acne from taking the OCP.  You become pregnant. Get help right away if:  You develop chest pain.  You develop shortness of breath.  You have an uncontrolled or severe headache.  You develop numbness or slurred speech.  You develop visual  problems.  You develop pain, redness, and swelling in the legs. This information is not intended to replace advice given to you by your health care provider. Make sure you discuss any questions you have with your health care provider. Document Released: 07/14/2011 Document Revised: 12/31/2015 Document Reviewed: 01/13/2013 Elsevier Interactive Patient Education  2017 [employer]

## 2016-09-22 NOTE — Progress Notes (Signed)
Subjective:    Patient ID: Anthoney Haradanna Engh, female    DOB: 05/02/1991, 26 y.o.   MRN: [id number]  HPI  Ms. Anthoney Haradanna Buer, a 26 year old female presents for contraceptive counseling.  Patient has a history of polycystic ovarian syndrome that was diagnosed 5 years ago. She says that she has had irregular menstrual cycles. Patient is looking to start birth control in order to regulate menstrual cycles. She says that she has been having unprotected sexual intercourse with partner and is requesting STD testing.  Pertinent past medical history includes weight gain and morbid obesity. :Weight gain started in early teens and has been increasing yearly. She says that she has attempted exercise and diet plans without sustained success. She currently does not exercise. She denies fatigue, intolerance to heat or cold, chest pain, polyuria, polydipsia, or polyphagia.  Past Medical History:  Diagnosis Date  . PCOS (polycystic ovarian syndrome)    at age 26    Social History   Social History  . Marital status: Single    Spouse name: N/A  . Number of children: N/A  . Years of education: N/A   Occupational History  . Not on file.   Social History Main Topics  . Smoking status: Never Smoker  . Smokeless tobacco: Never Used  . Alcohol use No  . Drug use: No  . Sexual activity: No   Other Topics Concern  . Not on file   Social History Narrative  . No narrative on file   Immunization History  Administered Date(s) Administered  . Influenza,inj,Quad PF,36+ Mos 05/11/2015  . Tdap 05/11/2015   Review of Systems  Constitutional: Negative.   HENT: Negative.   Eyes: Negative for photophobia, redness and visual disturbance.  Respiratory: Negative.   Cardiovascular: Negative.  Negative for chest pain, palpitations and leg swelling.  Gastrointestinal: Negative.  Negative for blood in stool, constipation, diarrhea and nausea.  Endocrine: Negative for cold intolerance, heat intolerance, polydipsia, polyphagia  and polyuria.  Genitourinary: Negative.   Musculoskeletal: Negative.   Skin: Negative.   Allergic/Immunologic: Negative.   Neurological: Negative.   Hematological: Negative.   Psychiatric/Behavioral: Negative.      Objective:   Physical Exam  Constitutional: She appears well-developed. She is active.  Morbid obesity  HENT:  Head: Normocephalic and atraumatic.  Eyes: Conjunctivae and EOM are normal. Pupils are equal, round, and reactive to light. Lids are everted and swept, no foreign bodies found.  Neck: Trachea normal, normal range of motion and full passive range of motion without pain. Neck supple.  Cardiovascular: Normal rate, normal heart sounds and intact distal pulses.   Pulmonary/Chest: Effort normal and breath sounds normal.  Abdominal: Soft. Bowel sounds are normal.  Musculoskeletal: Normal range of motion.  Neurological: She is alert.  Skin: Skin is warm and dry.     Psychiatric: She has a normal mood and affect. Her behavior is normal. Judgment and thought content normal.       BP 122/79 (BP Location: Right Arm, Patient Position: Sitting, Cuff Size: Large)   Temp 98.9 F (37.2 C) (Oral)   Resp 16   Ht 5\' 2"  (1.575 m)   Wt 293 lb (132.9 kg)   LMP 09/15/2016   SpO2 100%   BMI 53.59 kg/m  Assessment & Plan:  1. Counseling for birth control, oral contraceptives - Norethindrone-Ethinyl Estradiol-Fe Biphas (LO LOESTRIN FE) 1 MG-10 MCG / 10 MCG tablet; Take 1 tablet by mouth daily.  Dispense: 1 Package; Refill: 11 - POCT  urine pregnancy  2. Encounter for surveillance of contraceptive pills - Norethindrone-Ethinyl Estradiol-Fe Biphas (LO LOESTRIN FE) 1 MG-10 MCG / 10 MCG tablet; Take 1 tablet by mouth daily.  Dispense: 1 Package; Refill: 11  3. Cyst of skin - Ambulatory referral to Dermatology  4. Morbid obesity (HCC) Recommend a lowfat, low carbohydrate diet divided over 5-6 small meals, increase water intake to 6-8 glasses, and 150 minutes per week of  cardiovascular exercise.   - TSH - COMPLETE METABOLIC PANEL WITH GFR - Lipid Panel; Future - HgB A1c  5. Metabolic syndrome The patient is asked to make an attempt to improve diet and exercise patterns to aid in medical management of this problem.  6. Possible exposure to STD Recommend barrier protection with sexual intercourse - HIV antibody (with reflex) - GC/Chlamydia Probe Amp - RPR    RTC: 1 month for fasting cholesterol panel  Massie Maroon, FNP

## 2016-09-23 LAB — RPR

## 2016-09-23 LAB — GC/CHLAMYDIA PROBE AMP
CT Probe RNA: NOT DETECTED
GC PROBE AMP APTIMA: NOT DETECTED

## 2016-11-16 ENCOUNTER — Other Ambulatory Visit: Payer: Self-pay

## 2016-11-16 DIAGNOSIS — Z3041 Encounter for surveillance of contraceptive pills: Secondary | ICD-10-CM

## 2016-11-16 DIAGNOSIS — Z3009 Encounter for other general counseling and advice on contraception: Secondary | ICD-10-CM

## 2016-11-16 MED ORDER — NORETHIN-ETH ESTRAD-FE BIPHAS 1 MG-10 MCG / 10 MCG PO TABS: 1.0000 | ORAL_TABLET | Freq: Every day | ORAL | 11 refills | Status: DC

## 2016-11-16 NOTE — Telephone Encounter (Signed)
rx for birth control sent into mail order pharmacy per patients request. Thanks!

## 2017-06-23 ENCOUNTER — Encounter: Payer: Self-pay | Admitting: Family Medicine

## 2017-06-23 ENCOUNTER — Other Ambulatory Visit (HOSPITAL_COMMUNITY)
Admission: RE | Admit: 2017-06-23 | Discharge: 2017-06-23 | Disposition: A | Discharge status: Home / Self Care | Payer: Managed Care, Other (non HMO) | Source: Ambulatory Visit | Attending: Family Medicine | Admitting: Family Medicine

## 2017-06-23 ENCOUNTER — Ambulatory Visit (INDEPENDENT_AMBULATORY_CARE_PROVIDER_SITE_OTHER): Payer: Managed Care, Other (non HMO) | Admitting: Family Medicine

## 2017-06-23 VITALS — BP 126/76 | HR 94 | Temp 98.3°F | Resp 16 | Ht 62.0 in | Wt 318.0 lb

## 2017-06-23 DIAGNOSIS — Z01419 Encounter for gynecological examination (general) (routine) without abnormal findings: Secondary | ICD-10-CM

## 2017-06-23 DIAGNOSIS — Z1329 Encounter for screening for other suspected endocrine disorder: Secondary | ICD-10-CM | POA: Diagnosis not present

## 2017-06-23 DIAGNOSIS — E8881 Metabolic syndrome: Secondary | ICD-10-CM | POA: Insufficient documentation

## 2017-06-23 DIAGNOSIS — Z0000 Encounter for general adult medical examination without abnormal findings: Secondary | ICD-10-CM | POA: Diagnosis not present

## 2017-06-23 DIAGNOSIS — L0291 Cutaneous abscess, unspecified: Secondary | ICD-10-CM | POA: Diagnosis present

## 2017-06-23 DIAGNOSIS — Z3200 Encounter for pregnancy test, result unknown: Secondary | ICD-10-CM | POA: Diagnosis not present

## 2017-06-23 DIAGNOSIS — R809 Proteinuria, unspecified: Secondary | ICD-10-CM | POA: Diagnosis not present

## 2017-06-23 DIAGNOSIS — Z131 Encounter for screening for diabetes mellitus: Secondary | ICD-10-CM

## 2017-06-23 DIAGNOSIS — Z23 Encounter for immunization: Secondary | ICD-10-CM

## 2017-06-23 DIAGNOSIS — Z113 Encounter for screening for infections with a predominantly sexual mode of transmission: Secondary | ICD-10-CM | POA: Diagnosis not present

## 2017-06-23 LAB — POCT URINALYSIS DIP (DEVICE)
Bilirubin Urine: NEGATIVE
GLUCOSE, UA: NEGATIVE mg/dL
KETONES UR: NEGATIVE mg/dL
Leukocytes, UA: NEGATIVE
Nitrite: NEGATIVE
Protein, ur: 100 mg/dL — AB
Specific Gravity, Urine: >=1.03 (ref 1.005–1.030)
Urobilinogen, UA: 0.2 mg/dL (ref 0.0–1.0)
pH: 5.5 (ref 5.0–8.0)

## 2017-06-23 LAB — POCT GLYCOSYLATED HEMOGLOBIN (HGB A1C): Hemoglobin A1C: 5.6

## 2017-06-23 LAB — POCT URINE PREGNANCY: Preg Test, Ur: NEGATIVE

## 2017-06-23 MED ORDER — SULFAMETHOXAZOLE-TRIMETHOPRIM 800-160 MG PO TABS: 1.0000 | ORAL_TABLET | Freq: Two times a day (BID) | ORAL | 0 refills | Status: AC

## 2017-06-23 MED ORDER — FLUCONAZOLE 150 MG PO TABS: 150.0000 mg | ORAL_TABLET | Freq: Once | ORAL | 0 refills | Status: AC

## 2017-06-23 NOTE — Patient Instructions (Addendum)
  Increase physical activity as tolerated with a goal of engaging 150 minutes of vigorous exercise weekly to facilitate weight lose.   Pregnancy is negative today. In the event, you are interested in discussing fertility, I will refer you to a gynecologist to further evaluate PCOS and menstrual irregularity.  I am prescribing Bactrim 1 tablet, twice daily x 10 days for recurrent skin abscess.      Skin Abscess A skin abscess is an infected area on or under your skin that contains pus and other material. An abscess can happen almost anywhere on your body. Some abscesses break open (rupture) on their own. Most continue to get worse unless they are treated. The infection can spread deeper into the body and into your blood, which can make you feel sick. Treatment usually involves draining the abscess. Follow these instructions at home: Abscess Care  If you have an abscess that has not drained, place a warm, clean, wet washcloth over the abscess several times a day. Do this as told by your doctor.  Follow instructions from your doctor about how to take care of your abscess. Make sure you: ? Cover the abscess with a bandage (dressing). ? Change your bandage or gauze as told by your doctor. ? Wash your hands with soap and water before you change the bandage or gauze. If you cannot use soap and water, use hand sanitizer.  Check your abscess every day for signs that the infection is getting worse. Check for: ? More redness, swelling, or pain. ? More fluid or blood. ? Warmth. ? More pus or a bad smell. Medicines   Take over-the-counter and prescription medicines only as told by your doctor.  If you were prescribed an antibiotic medicine, take it as told by your doctor. Do not stop taking the antibiotic even if you start to feel better. General instructions  To avoid spreading the infection: ? Do not share personal care items, towels, or hot tubs with others. ? Avoid making skin-to-skin  contact with other people.  Keep all follow-up visits as told by your doctor. This is important. Contact a doctor if:  You have more redness, swelling, or pain around your abscess.  You have more fluid or blood coming from your abscess.  Your abscess feels warm when you touch it.  You have more pus or a bad smell coming from your abscess.  You have a fever.  Your muscles ache.  You have chills.  You feel sick. Get help right away if:  You have very bad (severe) pain.  You see red streaks on your skin spreading away from the abscess. This information is not intended to replace advice given to you by your health care provider. Make sure you discuss any questions you have with your health care provider. Document Released: 01/11/2008 Document Revised: 03/20/2016 Document Reviewed: 06/03/2015 Elsevier Interactive Patient Education  [employer]2018 Elsevier Inc.

## 2017-06-23 NOTE — Progress Notes (Signed)
Carolyn Gay, is a 26 y.o. female  ZOX:[id number]  [id number]:[id number]  DOB - [birth year]  CC:     Chief Complaint  Patient presents with  . Annual Exam  . Mouth Lesions  . Cyst    Under arm,breast   HPI: Carolyn Gay is a 26 y.o. female is here today to establish with a new provider and annual physical with gynecological exam. Medical history significant for PCOS (dx at 26 y.o), recurrent skin abscess, and morbid obesity. Family history is significant for: Mother and maternal grandmother are both diabetics, and maternal grandmother is in remission of breast cancer. Carolyn Gay reports irregular-regular menstrual cycle and is currently taking oral birth control. Patient's last menstrual period was 02/13/2017.  She temporarily discontinued OCP a few months ago for short period of time to attempt to elicit a menstrual cycle, however, period did not resume. She is sexually active. Carolyn Gay complains of recurrent skin cyst predominantly in the axilla area and under her breast.  At present she reports a 1 week history, of a tender to touch, purulent draining cyst under right breast.  She has cleansed the area with warm soap and water and applied antibacterial ointment without resolution of drainage or tenderness.  She reports these cyst recur spontaneously throughout the year and has been ongoing for several years. She reports no known history of diabetes or MRSA infection. Carolyn Gay denies chest pain, shortness of breath, new onset weakness or numbness, or cough.  No Known Allergies Past Medical History:  Diagnosis Date  . PCOS (polycystic ovarian syndrome)    at age 83    Current Outpatient Medications on File Prior to Visit  Medication Sig Dispense Refill  . Norethindrone-Ethinyl Estradiol-Fe Biphas (LO LOESTRIN FE) 1 MG-10 MCG / 10 MCG tablet Take 1 tablet by mouth daily. 1 Package 11   No current facility-administered medications on file prior to visit.    Family History  Problem Relation Age of Onset  . Diabetes  Mother   . Cancer Maternal Grandmother        breast   Social History   Socioeconomic History  . Marital status: Single    Spouse name: Not on file  . Number of children: Not on file  . Years of education: Not on file  . Highest education level: Not on file  Social Needs  . Financial resource strain: Not on file  . Food insecurity - worry: Not on file  . Food insecurity - inability: Not on file  . Transportation needs - medical: Not on file  . Transportation needs - non-medical: Not on file  Occupational History  . Not on file  Tobacco Use  . Smoking status: Never Smoker  . Smokeless tobacco: Never Used  Substance and Sexual Activity  . Alcohol use: No  . Drug use: No  . Sexual activity: No  Other Topics Concern  . Not on file  Social History Narrative  . Not on file    Review of Systems: Constitutional: Negative for fever, chills, diaphoresis, activity change, appetite change and fatigue. HENT: Negative for ear pain, nosebleeds, congestion, facial swelling, rhinorrhea, neck pain, neck stiffness and ear discharge.  Eyes: Negative for pain, discharge, redness, itching and visual disturbance. Respiratory: Negative for cough, choking, chest tightness, shortness of breath, wheezing and stridor.  Cardiovascular: Negative for chest pain, palpitations and leg swelling. Gastrointestinal: Negative for abdominal distention. Genitourinary: Negative for dysuria, urgency, frequency, hematuria, flank pain, decreased urine volume, difficulty urinating and dyspareunia.  Musculoskeletal: Negative for back  pain, joint swelling, arthralgia and gait problem. Neurological: Negative for dizziness, tremors, seizures, syncope, facial asymmetry, speech difficulty, weakness, light-headedness, numbness and headaches.  Skin: Skin cysts axilla and right breast. Right breast cyst, erythematous, purulent drainage. Lower lip cystic lesion-non-draining.   Hematological: Negative for adenopathy. Does not  bruise/bleed easily. Psychiatric/Behavioral: Negative for hallucinations, behavioral problems, confusion, dysphoric mood, decreased concentration and agitation.    Objective:   Vitals:   06/23/17 0938  BP: 126/76  Pulse: 94  Resp: 16  Temp: 98.3 F (36.8 C)  SpO2: 100%    Physical Exam: Constitutional: Patient appears well-developed and well-nourished. No distress. HENT: Normocephalic, atraumatic, External right and left ear normal. Oropharynx is clear and moist.  Eyes: Conjunctivae and EOM are normal. PERRLA, no scleral icterus. Neck: Normal ROM. Neck supple. No JVD. No tracheal deviation. No thyromegaly. CVS: RRR, S1/S2 +, no murmurs, no gallops, no carotid bruit.  Pulmonary: Effort and breath sounds normal, no stridor, rhonchi, wheezes, rales.  Abdominal: Soft. BS +, no distension, tenderness, rebound or guarding.  Musculoskeletal: Normal range of motion. No edema and no tenderness.  Lymphadenopathy: No lymphadenopathy noted, cervical, inguinal or axillary Neuro: Alert. Normal muscle tone coordination. No cranial nerve deficit. Skin: Skin is warm and dry. No rash noted. Not diaphoretic. No erythema. No pallor. Psychiatric: Normal mood and affect. Behavior, judgment, thought content normal.  Lab Results  Component Value Date   WBC 7.8 05/11/2015   HGB 11.8 (L) 05/11/2015   HCT 35.6 (L) 05/11/2015   MCV 78.2 05/11/2015   PLT 361 05/11/2015   Lab Results  Component Value Date   CREATININE 0.68 09/22/2016   BUN 12 09/22/2016   NA 138 09/22/2016   K 4.2 09/22/2016   CL 103 09/22/2016   CO2 27 09/22/2016    Lab Results  Component Value Date   HGBA1C 5.1 09/22/2016   Lipid Panel  No results found for: CHOL, TRIG, HDL, CHOLHDL, VLDL, LDLCALC      Assessment and plan:  1. Annual physical exam 2. Need for immunization against influenza 3. Encounter for pregnancy test, result unknown 4. Encounter for routine gynecological examination with Papanicolaou smear of  cervix 5. Screening for diabetes mellitus 6. Metabolic syndrome 7. Abscess 8. Screening for thyroid disorder 9. Routine screening for STI (sexually transmitted infection) 10. Proteinuria, unspecified type   Age-appropriate anticipatory guidance provided.  Obtain a Pap smear as a part of her routine gynecological exam.  On exam no abnormalities noted.  Pregnancy negative.  Patient is sexually active with inconsistent use of barrier protection and will obtain an STI panel today. Patient has a history of metabolic syndrome and is morbidly obese, A1c today is 5.6.  Checking a fasting lipid panel.  She is encouraged to engage in physical activity as tolerated with the goal of 150 minutes/week to reduce risk of cardiovascular disease.  Also obtaining a wound culture of abscess under her breast as well as cyst of lower lip. Due to  patients history of recurrent skin abscesses, will treat today with Septra and provide Diflucan in the event vaginal irritation occurs.  UA today showed the presence of gross protein will obtain a microalbumin level today.    Meds ordered this encounter  Medications  . sulfamethoxazole-trimethoprim (BACTRIM DS,SEPTRA DS) 800-160 MG tablet    Sig: Take 1 tablet 2 (two) times daily by mouth.    Dispense:  20 tablet    Refill:  0    Order Specific Question:   Supervising Provider  AnswerQuentin Angst:   JEGEDE, OLUGBEMIGA E [id number]]  . fluconazole (DIFLUCAN) 150 MG tablet    Sig: Take 1 tablet (150 mg total) once for 1 dose by mouth. Repeat if needed    Dispense:  2 tablet    Refill:  0    Order Specific Question:   Supervising Provider    Answer:   Quentin AngstJEGEDE, OLUGBEMIGA E [[id number]]      Orders Placed This Encounter  Procedures  . WOUND CULTURE  . WOUND CULTURE  . Flu Vaccine QUAD 36+ mos IM  . Lipid panel  . Basic metabolic panel  . CBC with Differential  . TSH  . HIV antibody  . RPR  . HSV(herpes simplex vrs) 1+2 ab-IgG  . Microalbumin, urine  . POCT urine pregnancy   . POCT urinalysis dip (device)  . POCT glycosylated hemoglobin (Hb A1C)    Return in about 6 months (around 12/21/2017), or routine wellness .  The patient was given clear instructions to go to ER or return to medical center if symptoms don't improve, worsen or new problems develop. The patient verbalized understanding. The patient was told to call to get lab results if they haven't heard anything in the next week.     This note has been created with Education officer, environmentalDragon speech recognition software and smart phrase technology. Any transcriptional errors are unintentional.

## 2017-06-24 LAB — MICROALBUMIN, URINE: MICROALB UR: 30.1 mg/dL

## 2017-06-26 LAB — CBC WITH DIFFERENTIAL/PLATELET
Basophils Absolute: 37 cells/uL (ref 0–200)
Basophils Relative: 0.4 %
EOS PCT: 1 %
Eosinophils Absolute: 93 cells/uL (ref 15–500)
HCT: 36 % (ref 35.0–45.0)
Hemoglobin: 11.7 g/dL (ref 11.7–15.5)
Lymphs Abs: 2195 cells/uL (ref 850–3900)
MCH: 26.4 pg — ABNORMAL LOW (ref 27.0–33.0)
MCHC: 32.5 g/dL (ref 32.0–36.0)
MCV: 81.3 fL (ref 80.0–100.0)
MPV: 9.6 fL (ref 7.5–12.5)
Monocytes Relative: 3.7 %
Neutro Abs: 6631 cells/uL (ref 1500–7800)
Neutrophils Relative %: 71.3 %
PLATELETS: 351 10*3/uL (ref 140–400)
RBC: 4.43 10*6/uL (ref 3.80–5.10)
RDW: 13.3 % (ref 11.0–15.0)
TOTAL LYMPHOCYTE: 23.6 %
WBC mixed population: 344 cells/uL (ref 200–950)
WBC: 9.3 10*3/uL (ref 3.8–10.8)

## 2017-06-26 LAB — WOUND CULTURE
MICRO NUMBER: 81295606
MICRO NUMBER:: 81295604
SPECIMEN QUALITY:: ADEQUATE
SPECIMEN QUALITY:: ADEQUATE

## 2017-06-26 LAB — HIV ANTIBODY (ROUTINE TESTING W REFLEX): HIV: NONREACTIVE

## 2017-06-26 LAB — CYTOLOGY - PAP
ADEQUACY: ABSENT
Bacterial vaginitis: NEGATIVE
Candida vaginitis: NEGATIVE
Chlamydia: NEGATIVE
DIAGNOSIS: NEGATIVE
Neisseria Gonorrhea: NEGATIVE
Trichomonas: NEGATIVE

## 2017-06-26 LAB — BASIC METABOLIC PANEL
BUN: 9 mg/dL (ref 7–25)
CHLORIDE: 104 mmol/L (ref 98–110)
CO2: 26 mmol/L (ref 20–32)
Calcium: 9.2 mg/dL (ref 8.6–10.2)
Creat: 0.61 mg/dL (ref 0.50–1.10)
Glucose, Bld: 85 mg/dL (ref 65–99)
Potassium: 4.2 mmol/L (ref 3.5–5.3)
Sodium: 137 mmol/L (ref 135–146)

## 2017-06-26 LAB — LIPID PANEL
CHOLESTEROL: 169 mg/dL (ref <200)
HDL: 45 mg/dL — AB (ref >50)
LDL Cholesterol (Calc): 105 mg/dL (calc) — ABNORMAL HIGH
Non-HDL Cholesterol (Calc): 124 mg/dL (calc) (ref <130)
Total CHOL/HDL Ratio: 3.8 (calc) (ref <5.0)
Triglycerides: 97 mg/dL (ref <150)

## 2017-06-26 LAB — HSV(HERPES SIMPLEX VRS) I + II AB-IGG
HAV 1 IGG,TYPE SPECIFIC AB: <0.9 index
HSV 2 IGG,TYPE SPECIFIC AB: 7.11 index — ABNORMAL HIGH

## 2017-06-26 LAB — RPR: RPR Ser Ql: NONREACTIVE

## 2017-06-26 LAB — TSH: TSH: 2.03 mIU/L

## 2017-07-11 ENCOUNTER — Encounter: Payer: Self-pay | Admitting: Family Medicine

## 2017-07-18 ENCOUNTER — Other Ambulatory Visit: Payer: Self-pay | Admitting: Family Medicine

## 2017-07-18 ENCOUNTER — Encounter: Payer: Self-pay | Admitting: Family Medicine

## 2017-07-18 ENCOUNTER — Telehealth: Payer: Self-pay

## 2017-07-18 MED ORDER — FLUCONAZOLE 150 MG PO TABS: 150.0000 mg | ORAL_TABLET | Freq: Once | ORAL | 0 refills | Status: AC

## 2017-07-18 NOTE — Progress Notes (Signed)
Diflucan has been sent to [facility] pharmacy.

## 2017-09-18 ENCOUNTER — Ambulatory Visit (INDEPENDENT_AMBULATORY_CARE_PROVIDER_SITE_OTHER): Payer: 59 | Admitting: Family Medicine

## 2017-09-18 ENCOUNTER — Encounter: Payer: Self-pay | Admitting: Family Medicine

## 2017-09-18 VITALS — BP 140/96 | HR 110 | Temp 98.6°F | Resp 18 | Ht 62.0 in | Wt 315.8 lb

## 2017-09-18 DIAGNOSIS — M25562 Pain in left knee: Secondary | ICD-10-CM

## 2017-09-18 MED ORDER — PREDNISONE 20 MG PO TABS: ORAL_TABLET | ORAL | 0 refills | Status: AC

## 2017-09-18 MED ORDER — DICLOFENAC SODIUM 1 % TD GEL: 2.0000 g | Freq: Four times a day (QID) | TRANSDERMAL | 1 refills | Status: AC

## 2017-09-18 NOTE — Patient Instructions (Signed)

## 2017-09-18 NOTE — Progress Notes (Signed)
Patient ID: Carolyn Gay, female    DOB: May 06, 1991, 27 y.o.   MRN: [id number]  PCP: Bing Neighbors, FNP  Chief Complaint  Patient presents with  . Knee Pain    left 3 weeks    Subjective:  HPIEnna Gay is a 27 y.o. female with a history of morbid obesity. presents for evaluation of left knee pain x 3 weeks. Problem has been present intermittently for the course of several weeks. Reports no recent or prior injury. Pain is most pronounced with bending of knees and walking downstairs. She denies crepitus. Attempted relief with warm compresses and ibuprofen with only mild temporary relief. No known family history of autoimmune or MSK diseases or disorders. Social History   Socioeconomic History  . Marital status: Single    Spouse name: Not on file  . Number of children: Not on file  . Years of education: Not on file  . Highest education level: Not on file  Social Needs  . Financial resource strain: Not on file  . Food insecurity - worry: Not on file  . Food insecurity - inability: Not on file  . Transportation needs - medical: Not on file  . Transportation needs - non-medical: Not on file  Occupational History  . Not on file  Tobacco Use  . Smoking status: Never Smoker  . Smokeless tobacco: Never Used  Substance and Sexual Activity  . Alcohol use: No  . Drug use: No  . Sexual activity: No  Other Topics Concern  . Not on file  Social History Narrative  . Not on file    Family History  Problem Relation Age of Onset  . Diabetes Mother   . Cancer Maternal Grandmother        breast   Review of Systems Pertinent negatives listed in HPI   Patient Active Problem List   Diagnosis Date Noted  . Counseling for birth control, oral contraceptives 09/22/2016  . Morbid obesity (HCC) 09/22/2016  . Cyst of skin 09/22/2016  . Metabolic syndrome 09/22/2016    No Known Allergies  Prior to Admission medications   Medication Sig Start Date End Date Taking? Authorizing Provider   Norethindrone-Ethinyl Estradiol-Fe Biphas (LO LOESTRIN FE) 1 MG-10 MCG / 10 MCG tablet Take 1 tablet by mouth daily. 11/16/16  Yes Massie Maroon, FNP  sulfamethoxazole-trimethoprim (BACTRIM DS,SEPTRA DS) 800-160 MG tablet Take 1 tablet 2 (two) times daily by mouth. 06/23/17   Bing Neighbors, FNP    Past Medical, Surgical Family and Social History reviewed and updated.    Objective:   Today's Vitals   09/18/17 0857  BP: (!) 140/96  Pulse: (!) 110  Resp: 18  Temp: 98.6 F (37 C)  TempSrc: Oral  SpO2: 98%  Weight: (!) 315 lb 12.8 oz (143.2 kg)  Height: 5\' 2"  (1.575 m)    Wt Readings from Last 3 Encounters:  09/18/17 (!) 315 lb 12.8 oz (143.2 kg)  06/23/17 (!) 318 lb (144.2 kg)  09/22/16 293 lb (132.9 kg)   Physical Exam  Constitutional: She is oriented to person, place, and time. She appears well-developed and well-nourished.  HENT:  Head: Normocephalic and atraumatic.  Eyes: Conjunctivae are normal. Pupils are equal, round, and reactive to light.  Neck: Normal range of motion. Neck supple.  Cardiovascular: Normal rate, regular rhythm, normal heart sounds and intact distal pulses.  Pulmonary/Chest: Effort normal and breath sounds normal.  Musculoskeletal:       Left knee: She exhibits swelling. Tenderness found.  Lateral joint line tenderness noted.  Crepitus noted of the left knee   Lymphadenopathy:    She has no cervical adenopathy.  Neurological: She is alert and oriented to person, place, and time.  Skin: Skin is warm and dry.  Psychiatric: She has a normal mood and affect. Her behavior is normal. Judgment and thought content normal.    Assessment & Plan:  1. Acute pain of left knee, will obtain a complete left knee image. Trial prednisone taper and diclofenac gel to reduce inflammation. If imaging abnormal and or symptoms worsen, will refer to orthopedic specialty.  Meds ordered this encounter  Medications  . predniSONE (DELTASONE) 20 MG tablet    Sig: Take  3 PO QAM x3days, 2 PO QAM x3days, 1 PO QAM x3days    Dispense:  18 tablet    Refill:  0    Order Specific Question:   Supervising Provider    Answer:   Quentin AngstJEGEDE, OLUGBEMIGA E [id number]]  . diclofenac sodium (VOLTAREN) 1 % GEL    Sig: Apply 2 g topically 4 (four) times daily.    Dispense:  100 g    Refill:  1    Order Specific Question:   Supervising Provider    Answer:   Quentin AngstJEGEDE, OLUGBEMIGA E [id number]]    Orders Placed This Encounter  Procedures  . DG Knee Complete 4 Views Left    RTC : Keep scheduled follow-up on file.   Godfrey PickKimberly S. Tiburcio PeaHarris, MSN, FNP-C [facility]  [address] [place], [place] [address] [phone]

## 2017-09-19 ENCOUNTER — Ambulatory Visit (HOSPITAL_COMMUNITY)
Admission: RE | Admit: 2017-09-19 | Discharge: 2017-09-19 | Disposition: A | Discharge status: Home / Self Care | Payer: 59 | Source: Ambulatory Visit | Attending: Family Medicine | Admitting: Family Medicine

## 2017-09-19 DIAGNOSIS — M25562 Pain in left knee: Secondary | ICD-10-CM | POA: Insufficient documentation

## 2017-09-29 ENCOUNTER — Other Ambulatory Visit: Payer: Self-pay | Admitting: Family Medicine

## 2017-09-29 DIAGNOSIS — Z3041 Encounter for surveillance of contraceptive pills: Secondary | ICD-10-CM

## 2017-09-29 DIAGNOSIS — Z3009 Encounter for other general counseling and advice on contraception: Secondary | ICD-10-CM

## 2017-10-02 ENCOUNTER — Encounter (INDEPENDENT_AMBULATORY_CARE_PROVIDER_SITE_OTHER): Payer: Self-pay | Admitting: Orthopaedic Surgery

## 2017-10-02 ENCOUNTER — Ambulatory Visit (INDEPENDENT_AMBULATORY_CARE_PROVIDER_SITE_OTHER): Payer: 59 | Admitting: Orthopaedic Surgery

## 2017-10-02 DIAGNOSIS — M222X2 Patellofemoral disorders, left knee: Secondary | ICD-10-CM | POA: Insufficient documentation

## 2017-10-02 NOTE — Progress Notes (Signed)
Office Visit Note   Patient: Carolyn Gay           Date of Birth: 06/21/1991           MRN: [id number] Visit Date: 10/02/2017              Requested by: Bing NeighborsHarris, Kimberly S, FNP [address] [place], [place] [address] PCP: Bing NeighborsHarris, Kimberly S, FNP   Assessment & Plan: Visit Diagnoses:  1. Patellofemoral syndrome of left knee     Plan: Impression is left knee patellofemoral syndrome.  At this point we will have the patient continue with her PSO brace.  We will start her in outpatient physical therapy for the above condition and they will out a program for her of things to do and not to do one at the gym.  I have counseled her on weight loss as well.  She will follow-up with us on an as-needed basis.  Follow-Up Instructions: Return if symptoms worsen or fail to improve.   Orders:  No orders of the defined types were placed in this encounter.  No orders of the defined types were placed in this encounter.     Procedures: No procedures performed   Clinical Data: No additional findings.   Subjective: Chief Complaint  Patient presents with  . Left Knee - Pain    HPI Carolyn Gay is a pleasant 27 year old female who presents our clinic today with left knee pain.  This began approximately 3 months ago and is worsened over the past month.  No known injury or change in activity.  All her pain is anterior aspect.  She describes this as a constant ache worse going downstairs.  She was seen by her primary care provider about a week ago where she was given a PSO brace as well as put on a steroid Dosepak.  She states that the brace is significantly helped, but the steroids seem to make the pain worse.  She has not tried physical therapy.  No radicular symptoms noted.  No previous injection or surgical intervention in the past.  She is morbidly obese and would like to get back to working out the gym.  Review of Systems as detailed in HPI.  All others reviewed and are negative.   Objective: Vital  Signs: There were no vitals taken for this visit.  Physical Exam well-developed well-nourished female in no acute distress.  Alert and oriented x3.  Ortho Exam examination of the left knee reveals no effusion.  Range of motion 0-100 degrees.  Medial joint line tenderness.  No patellofemoral crepitus.  Ligaments are stable.  She does have a markedly increased Q angle.  No patella tracking or tethering.  She is neurovascular intact distally.  Specialty Comments:  No specialty comments available.  Imaging: No new imaging today.     PMFS History: Patient Active Problem List   Diagnosis Date Noted  . Patellofemoral syndrome of left knee 10/02/2017  . Counseling for birth control, oral contraceptives 09/22/2016  . Morbid obesity (HCC) 09/22/2016  . Cyst of skin 09/22/2016  . Metabolic syndrome 09/22/2016   Past Medical History:  Diagnosis Date  . PCOS (polycystic ovarian syndrome)    at age 27     Family History  Problem Relation Age of Onset  . Diabetes Mother   . Cancer Maternal Grandmother        breast    History reviewed. No pertinent surgical history. Social History   Occupational History  . Not on file  Tobacco Use  . Smoking status: Never Smoker  . Smokeless tobacco: Never Used  Substance and Sexual Activity  . Alcohol use: No  . Drug use: No  . Sexual activity: No

## 2017-10-16 ENCOUNTER — Ambulatory Visit (INDEPENDENT_AMBULATORY_CARE_PROVIDER_SITE_OTHER): Payer: 59 | Admitting: Family Medicine

## 2017-10-16 ENCOUNTER — Encounter: Payer: Self-pay | Admitting: Family Medicine

## 2017-10-16 VITALS — BP 138/92 | HR 100 | Temp 98.2°F | Resp 14 | Ht 62.0 in | Wt 314.0 lb

## 2017-10-16 DIAGNOSIS — B372 Candidiasis of skin and nail: Secondary | ICD-10-CM

## 2017-10-16 DIAGNOSIS — I10 Essential (primary) hypertension: Secondary | ICD-10-CM | POA: Diagnosis not present

## 2017-10-16 MED ORDER — CLOTRIMAZOLE-BETAMETHASONE 1-0.05 % EX CREA: 1.0000 "application " | TOPICAL_CREAM | Freq: Two times a day (BID) | CUTANEOUS | 0 refills | Status: AC

## 2017-10-16 MED ORDER — NYSTATIN 100000 UNIT/GM EX POWD
Freq: Four times a day (QID) | CUTANEOUS | 0 refills | Status: AC
Start: 2017-10-16 — End: ?

## 2017-10-16 MED ORDER — HYDROCHLOROTHIAZIDE 12.5 MG PO CAPS: 12.5000 mg | ORAL_CAPSULE | Freq: Every day | ORAL | 1 refills | Status: AC

## 2017-10-16 NOTE — Patient Instructions (Signed)
Goal 130/90. Reducing sodium.   DASH Eating Plan DASH stands for "Dietary Approaches to Stop Hypertension." The DASH eating plan is a healthy eating plan that has been shown to reduce high blood pressure (hypertension). It may also reduce your risk for type 2 diabetes, heart disease, and stroke. The DASH eating plan may also help with weight loss. What are tips for following this plan? General guidelines  Avoid eating more than 2,300 mg (milligrams) of salt (sodium) a day. If you have hypertension, you may need to reduce your sodium intake to 1,500 mg a day.  Limit alcohol intake to no more than 1 drink a day for nonpregnant women and 2 drinks a day for men. One drink equals 12 oz of beer, 5 oz of wine, or 1 oz of hard liquor.  Work with your health care provider to maintain a healthy body weight or to lose weight. Ask what an ideal weight is for you.  Get at least 30 minutes of exercise that causes your heart to beat faster (aerobic exercise) most days of the week. Activities may include walking, swimming, or biking.  Work with your health care provider or diet and nutrition specialist (dietitian) to adjust your eating plan to your individual calorie needs. Reading food labels  Check food labels for the amount of sodium per serving. Choose foods with less than 5 percent of the Daily Value of sodium. Generally, foods with less than 300 mg of sodium per serving fit into this eating plan.  To find whole grains, look for the word "whole" as the first word in the ingredient list. Shopping  Buy products labeled as "low-sodium" or "no salt added."  Buy fresh foods. Avoid canned foods and premade or frozen meals. Cooking  Avoid adding salt when cooking. Use salt-free seasonings or herbs instead of table salt or sea salt. Check with your health care provider or pharmacist before using salt substitutes.  Do not fry foods. Cook foods using healthy methods such as baking, boiling, grilling, and  broiling instead.  Cook with heart-healthy oils, such as olive, canola, soybean, or sunflower oil. Meal planning   Eat a balanced diet that includes: ? 5 or more servings of fruits and vegetables each day. At each meal, try to fill half of your plate with fruits and vegetables. ? Up to 6-8 servings of whole grains each day. ? Less than 6 oz of lean meat, poultry, or fish each day. A 3-oz serving of meat is about the same size as a deck of cards. One egg equals 1 oz. ? 2 servings of low-fat dairy each day. ? A serving of nuts, seeds, or beans 5 times each week. ? Heart-healthy fats. Healthy fats called Omega-3 fatty acids are found in foods such as flaxseeds and coldwater fish, like sardines, salmon, and mackerel.  Limit how much you eat of the following: ? Canned or prepackaged foods. ? Food that is high in trans fat, such as fried foods. ? Food that is high in saturated fat, such as fatty meat. ? Sweets, desserts, sugary drinks, and other foods with added sugar. ? Full-fat dairy products.  Do not salt foods before eating.  Try to eat at least 2 vegetarian meals each week.  Eat more home-cooked food and less restaurant, buffet, and fast food.  When eating at a restaurant, ask that your food be prepared with less salt or no salt, if possible. What foods are recommended? The items listed may not be a complete list.  Talk with your dietitian about what dietary choices are best for you. Grains Whole-grain or whole-wheat bread. Whole-grain or whole-wheat pasta. Brown rice. [place]. Bulgur. Whole-grain and low-sodium cereals. Pita bread. Low-fat, low-sodium crackers. Whole-wheat flour tortillas. Vegetables Fresh or frozen vegetables (raw, steamed, roasted, or grilled). Low-sodium or reduced-sodium tomato and vegetable juice. Low-sodium or reduced-sodium tomato sauce and tomato paste. Low-sodium or reduced-sodium canned vegetables. Fruits All fresh, dried, or frozen fruit. Canned  fruit in natural juice (without added sugar). Meat and other protein foods Skinless chicken or [place]. Ground chicken or [place]. Pork with fat trimmed off. Fish and seafood. Egg whites. Dried beans, peas, or lentils. Unsalted nuts, nut butters, and seeds. Unsalted canned beans. Lean cuts of beef with fat trimmed off. Low-sodium, lean deli meat. Dairy Low-fat (1%) or fat-free (skim) milk. Fat-free, low-fat, or reduced-fat cheeses. Nonfat, low-sodium ricotta or cottage cheese. Low-fat or nonfat yogurt. Low-fat, low-sodium cheese. Fats and oils Soft margarine without trans fats. Vegetable oil. Low-fat, reduced-fat, or light mayonnaise and salad dressings (reduced-sodium). Canola, safflower, olive, soybean, and sunflower oils. Avocado. Seasoning and other foods Herbs. Spices. Seasoning mixes without salt. Unsalted popcorn and pretzels. Fat-free sweets. What foods are not recommended? The items listed may not be a complete list. Talk with your dietitian about what dietary choices are best for you. Grains Baked goods made with fat, such as croissants, muffins, or some breads. Dry pasta or rice meal packs. Vegetables Creamed or fried vegetables. Vegetables in a cheese sauce. Regular canned vegetables (not low-sodium or reduced-sodium). Regular canned tomato sauce and paste (not low-sodium or reduced-sodium). Regular tomato and vegetable juice (not low-sodium or reduced-sodium). Angie Fava. Olives. Fruits Canned fruit in a light or heavy syrup. Fried fruit. Fruit in cream or butter sauce. Meat and other protein foods Fatty cuts of meat. Ribs. Fried meat. Berniece Salines. Sausage. Bologna and other processed lunch meats. Salami. Fatback. Hotdogs. Bratwurst. Salted nuts and seeds. Canned beans with added salt. Canned or smoked fish. Whole eggs or egg yolks. Chicken or [place] with skin. Dairy Whole or 2% milk, cream, and half-and-half. Whole or full-fat cream cheese. Whole-fat or sweetened yogurt. Full-fat cheese.  Nondairy creamers. Whipped toppings. Processed cheese and cheese spreads. Fats and oils Butter. Stick margarine. Lard. Shortening. Ghee. Bacon fat. Tropical oils, such as coconut, palm kernel, or palm oil. Seasoning and other foods Salted popcorn and pretzels. Onion salt, garlic salt, seasoned salt, table salt, and sea salt. Worcestershire sauce. Tartar sauce. Barbecue sauce. Teriyaki sauce. Soy sauce, including reduced-sodium. Steak sauce. Canned and packaged gravies. Fish sauce. Oyster sauce. Cocktail sauce. Horseradish that you find on the shelf. Ketchup. Mustard. Meat flavorings and tenderizers. Bouillon cubes. Hot sauce and Tabasco sauce. Premade or packaged marinades. Premade or packaged taco seasonings. Relishes. Regular salad dressings. Where to find more information:  National Heart, Lung, and [place]: https://wilson-eaton.com/  American Heart Association: www.heart.org Summary  The DASH eating plan is a healthy eating plan that has been shown to reduce high blood pressure (hypertension). It may also reduce your risk for type 2 diabetes, heart disease, and stroke.  With the DASH eating plan, you should limit salt (sodium) intake to 2,300 mg a day. If you have hypertension, you may need to reduce your sodium intake to 1,500 mg a day.  When on the DASH eating plan, aim to eat more fresh fruits and vegetables, whole grains, lean proteins, low-fat dairy, and heart-healthy fats.  Work with your health care provider or diet and nutrition specialist (dietitian) to adjust  your eating plan to your individual calorie needs. This information is not intended to replace advice given to you by your health care provider. Make sure you discuss any questions you have with your health care provider. Document Released: 07/14/2011 Document Revised: 07/18/2016 Document Reviewed: 07/18/2016 Elsevier Interactive Patient Education  [employer]2018 Elsevier Inc.

## 2017-10-16 NOTE — Progress Notes (Signed)
Patient ID: Carolyn Gay, female    DOB: 07/08/1991, 27 y.o.   MRN: [id number]  PCP: Bing Neighbors, FNP  Chief Complaint  Patient presents with  . Follow-up    4 weeks on knee pain    Subjective:  HPI Carolyn Gay is a 27 y.o. female with obesity, chronic bilateral knee pain, and persistently elevated BP without a diagnosis of HTN,  presents for evaluation of knee pain. Carolyn Gay recently followed-up with [facility] for evaluation of knee pain and was found to have patellofemoral syndrome of left knee. She was advised to wear knee brace and follow-up with outpatient therapy. She reports knee pain has improved. She continues to make efforts to loose weight and increase physical activity. Carolyn Gay also has had elevated blood pressure readings consistently during prior office visit. She has a positive family history with both parents suffering for hypertension. She has never taken blood pressure medication before. Denies chest pain, headaches, dizziness, or weakness. Carolyn Gay complains of chaffing and itching below her breast extending along her bra line. This is exacerbated by sweating. This has been long -standing problem. She is not a diabetic.  Social History   Socioeconomic History  . Marital status: Single    Spouse name: Not on file  . Number of children: Not on file  . Years of education: Not on file  . Highest education level: Not on file  Social Needs  . Financial resource strain: Not on file  . Food insecurity - worry: Not on file  . Food insecurity - inability: Not on file  . Transportation needs - medical: Not on file  . Transportation needs - non-medical: Not on file  Occupational History  . Not on file  Tobacco Use  . Smoking status: Never Smoker  . Smokeless tobacco: Never Used  Substance and Sexual Activity  . Alcohol use: No  . Drug use: No  . Sexual activity: No  Other Topics Concern  . Not on file  Social History Narrative  . Not on file    Family History   Problem Relation Age of Onset  . Diabetes Mother   . Cancer Maternal Grandmother        breast     Review of Systems  Patient Active Problem List   Diagnosis Date Noted  . Patellofemoral syndrome of left knee 10/02/2017  . Counseling for birth control, oral contraceptives 09/22/2016  . Morbid obesity (HCC) 09/22/2016  . Cyst of skin 09/22/2016  . Metabolic syndrome 09/22/2016    No Known Allergies  Prior to Admission medications   Medication Sig Start Date End Date Taking? Authorizing Provider  LO LOESTRIN FE 1 MG-10 MCG / 10 MCG tablet TAKE 1 TABLET BY MOUTH  DAILY 10/02/17  Yes Bing Neighbors, FNP  diclofenac sodium (VOLTAREN) 1 % GEL Apply 2 g topically 4 (four) times daily. Patient not taking: Reported on 10/16/2017 09/18/17   Bing Neighbors, FNP  predniSONE (DELTASONE) 20 MG tablet Take 3 PO QAM x3days, 2 PO QAM x3days, 1 PO QAM x3days Patient not taking: Reported on 10/16/2017 09/18/17   Bing Neighbors, FNP  sulfamethoxazole-trimethoprim (BACTRIM DS,SEPTRA DS) 800-160 MG tablet Take 1 tablet 2 (two) times daily by mouth. Patient not taking: Reported on 10/16/2017 06/23/17   Bing Neighbors, FNP    Past Medical, Surgical Family and Social History reviewed and updated.    Objective:   Today's Vitals   10/16/17 0845  BP: 138/90  Pulse: 100  Resp:  14  Temp: 98.2 F (36.8 C)  SpO2: 99%  Weight: (!) 314 lb (142.4 kg)  Height: 5\' 2"  (1.575 m)    Wt Readings from Last 3 Encounters:  10/16/17 (!) 314 lb (142.4 kg)  09/18/17 (!) 315 lb 12.8 oz (143.2 kg)  06/23/17 (!) 318 lb (144.2 kg)    Physical Exam  Constitutional: She is oriented to person, place, and time. She appears well-developed.  HENT:  Head: Normocephalic and atraumatic.  Eyes: Conjunctivae and EOM are normal. Pupils are equal, round, and reactive to light.  Neck: Neck supple.  Pulmonary/Chest: Effort normal and breath sounds normal.  Musculoskeletal: Normal range of motion. She exhibits  no tenderness.  Neurological: She is alert and oriented to person, place, and time.  Skin: Skin is warm. Rash noted.  Psychiatric: She has a normal mood and affect. Her behavior is normal. Judgment and thought content normal.   Assessment & Plan:  1. Yeast dermatitis, will trial Lotrisone cream and Nystatin powder for management of rash. If no improvement, return for further evaluation.  2. Essential Hypertension, will start HCTZ 12.5 mg for hypertension management. We have discussed target BP range and blood pressure goal. I have advised patient to check BP regularly and to call us back or report to clinic if the numbers are consistently higher than 130/90. We discussed the importance of compliance with medical therapy and DASH diet recommended, consequences of uncontrolled hypertension discussed.   3. Knee pain, bilateral , continue follow-up with orthopedic surgery and physical rehabilitation.      Meds ordered this encounter  Medications  . nystatin (MYCOSTATIN/NYSTOP) powder    Sig: Apply topically 4 (four) times daily.    Dispense:  15 g    Refill:  0  . clotrimazole-betamethasone (LOTRISONE) cream    Sig: Apply 1 application topically 2 (two) times daily.    Dispense:  90 g    Refill:  0    Order Specific Question:   Supervising Provider    Answer:   Quentin AngstJEGEDE, OLUGBEMIGA E [id number]]  . hydrochlorothiazide (MICROZIDE) 12.5 MG capsule    Sig: Take 1 capsule (12.5 mg total) by mouth daily.    Dispense:  90 capsule    Refill:  1    Order Specific Question:   Supervising Provider    Answer:   Quentin AngstJEGEDE, OLUGBEMIGA E [[id number]]     RTC: BP check in 4 weeks and 6 months for hypertension follow-up  Godfrey PickKimberly S. Tiburcio PeaHarris, MSN, FNP-C [facility]  [address] [place], [place] [address] [phone]

## 2017-10-17 NOTE — Progress Notes (Signed)
Patient ID: Carolyn Gay, female    DOB: 05/31/1991, 27 y.o.   MRN: [id number]  PCP: Bing NeighborsHarris, Kimberly S, FNP  Chief Complaint  Patient presents with  . Follow-up    4 weeks on knee pain    yeast  Subjective:  HPI  Carolyn Gay is a 27 y.o. female presents for evaluation of knee pain. Patient was [facility] on 10/02/17 and was advised to continue her PSO brace. Patient has since then been started with outpatient physical therapy. Patient denies any complaints of knee pain. Patient endorses full range of motion without pain and has also started physical exercise. Patient endorses since recent exercise she has had an increase of sweating in skin folds and has had some yeast and chaffing in those areas. Patient denies any complaints of chest pain, palpitations, or shortness of breath.    Social History   Socioeconomic History  . Marital status: Single    Spouse name: Not on file  . Number of children: Not on file  . Years of education: Not on file  . Highest education level: Not on file  Social Needs  . Financial resource strain: Not on file  . Food insecurity - worry: Not on file  . Food insecurity - inability: Not on file  . Transportation needs - medical: Not on file  . Transportation needs - non-medical: Not on file  Occupational History  . Not on file  Tobacco Use  . Smoking status: Never Smoker  . Smokeless tobacco: Never Used  Substance and Sexual Activity  . Alcohol use: No  . Drug use: No  . Sexual activity: No  Other Topics Concern  . Not on file  Social History Narrative  . Not on file    Family History  Problem Relation Age of Onset  . Diabetes Mother   . Cancer Maternal Grandmother        breast     Review of Systems  Constitutional: Negative.   Respiratory: Negative.   Cardiovascular: Negative.   Skin: Positive for rash.       Endorses sweat in folds and yeast rash   Neurological: Negative.   Psychiatric/Behavioral: Negative.      Patient Active Problem List   Diagnosis Date Noted  . Patellofemoral syndrome of left knee 10/02/2017  . Counseling for birth control, oral contraceptives 09/22/2016  . Morbid obesity (HCC) 09/22/2016  . Cyst of skin 09/22/2016  . Metabolic syndrome 09/22/2016    No Known Allergies  Prior to Admission medications   Medication Sig Start Date End Date Taking? Authorizing Provider  LO LOESTRIN FE 1 MG-10 MCG / 10 MCG tablet TAKE 1 TABLET BY MOUTH  DAILY 10/02/17  Yes Bing NeighborsHarris, Kimberly S, FNP  clotrimazole-betamethasone (LOTRISONE) cream Apply 1 application topically 2 (two) times daily. 10/16/17   Bing NeighborsHarris, Kimberly S, FNP  diclofenac sodium (VOLTAREN) 1 % GEL Apply 2 g topically 4 (four) times daily. Patient not taking: Reported on 10/16/2017 09/18/17   Bing NeighborsHarris, Kimberly S, FNP  hydrochlorothiazide (MICROZIDE) 12.5 MG capsule Take 1 capsule (12.5 mg total) by mouth daily. 10/16/17   Bing NeighborsHarris, Kimberly S, FNP  nystatin (MYCOSTATIN/NYSTOP) powder Apply topically 4 (four) times daily. 10/16/17   Bing NeighborsHarris, Kimberly S, FNP  predniSONE (DELTASONE) 20 MG tablet Take 3 PO QAM x3days, 2 PO QAM x3days, 1 PO QAM x3days Patient not taking: Reported on 10/16/2017 09/18/17   Bing NeighborsHarris, Kimberly S, FNP  sulfamethoxazole-trimethoprim (BACTRIM DS,SEPTRA DS) 800-160 MG tablet Take 1 tablet 2 (two)  times daily by mouth. Patient not taking: Reported on 10/16/2017 06/23/17   Bing Neighbors, FNP    Past Medical, Surgical Family and Social History reviewed and updated.    Objective:   Today's Vitals   10/16/17 0845 10/16/17 0926 10/16/17 0927  BP: 138/90 133/85 (!) 138/92  Pulse: 100    Resp: 14    Temp: 98.2 F (36.8 C)    SpO2: 99%    Weight: (!) 314 lb (142.4 kg)    Height: 5\' 2"  (1.575 m)      Wt Readings from Last 3 Encounters:  10/16/17 (!) 314 lb (142.4 kg)  09/18/17 (!) 315 lb 12.8 oz (143.2 kg)  06/23/17 (!) 318 lb (144.2 kg)    Physical Exam  Constitutional: She is oriented to person, place,  and time. She appears well-developed and well-nourished.  Cardiovascular: Normal rate, regular rhythm and normal heart sounds.  Pulmonary/Chest: Effort normal and breath sounds normal.  Musculoskeletal: Normal range of motion.  Full range if motion in left knee, no pain illicited with movement or palpation. No crepitus felt.  Neurological: She is alert and oriented to person, place, and time.  Skin: Skin is warm. Rash noted.  Moisture associated rash in skin folds on flanks, and under breast  Psychiatric: She has a normal mood and affect. Her behavior is normal. Judgment and thought content normal.  Vitals reviewed.          Assessment & Plan:  1. Yeast dermatitis   - nystatin (MYCOSTATIN/NYSTOP) powder; Apply topically 4 (four) times daily.  Dispense: 15 g; Refill: 0 - lotrisone cream apply two times daily   2. Essential hypertension  Started on hydrochlorothiazide 12.5 mg daily- has had elevated blood pressure readings consistently since last visit.   If symptoms worsen or do not improve, return for follow-up, follow-up with PCP, or at the emergency department if severity of symptoms warrant a higher level of care.

## 2017-12-06 ENCOUNTER — Other Ambulatory Visit: Payer: Self-pay | Admitting: Family Medicine

## 2017-12-06 DIAGNOSIS — Z3009 Encounter for other general counseling and advice on contraception: Secondary | ICD-10-CM

## 2017-12-06 DIAGNOSIS — Z3041 Encounter for surveillance of contraceptive pills: Secondary | ICD-10-CM

## 2018-01-25 ENCOUNTER — Other Ambulatory Visit: Payer: Self-pay | Admitting: Internal Medicine

## 2018-01-25 DIAGNOSIS — Z3041 Encounter for surveillance of contraceptive pills: Secondary | ICD-10-CM

## 2018-01-25 DIAGNOSIS — Z3009 Encounter for other general counseling and advice on contraception: Secondary | ICD-10-CM

## 2018-02-10 ENCOUNTER — Other Ambulatory Visit: Payer: Self-pay | Admitting: Internal Medicine

## 2018-02-10 DIAGNOSIS — Z3041 Encounter for surveillance of contraceptive pills: Secondary | ICD-10-CM

## 2018-02-10 DIAGNOSIS — Z3009 Encounter for other general counseling and advice on contraception: Secondary | ICD-10-CM

## 2018-03-06 ENCOUNTER — Other Ambulatory Visit: Payer: Self-pay | Admitting: Internal Medicine

## 2018-03-06 DIAGNOSIS — Z3041 Encounter for surveillance of contraceptive pills: Secondary | ICD-10-CM

## 2018-03-06 DIAGNOSIS — Z3009 Encounter for other general counseling and advice on contraception: Secondary | ICD-10-CM

## 2018-12-04 NOTE — Telephone Encounter (Signed)
Message sent to provider 

## 2019-01-26 IMAGING — CR DG KNEE COMPLETE 4+V*L*
4 series · 4 of 4 positions shown · non-contrast
Comparison: None in PACs

CLINICAL DATA: Three weeks of knee pain with no known injury. Then
the patient fell this week and has had increased pain and is
limping.

EXAM:
LEFT KNEE - COMPLETE 4+ VIEW

[t knee ap left]
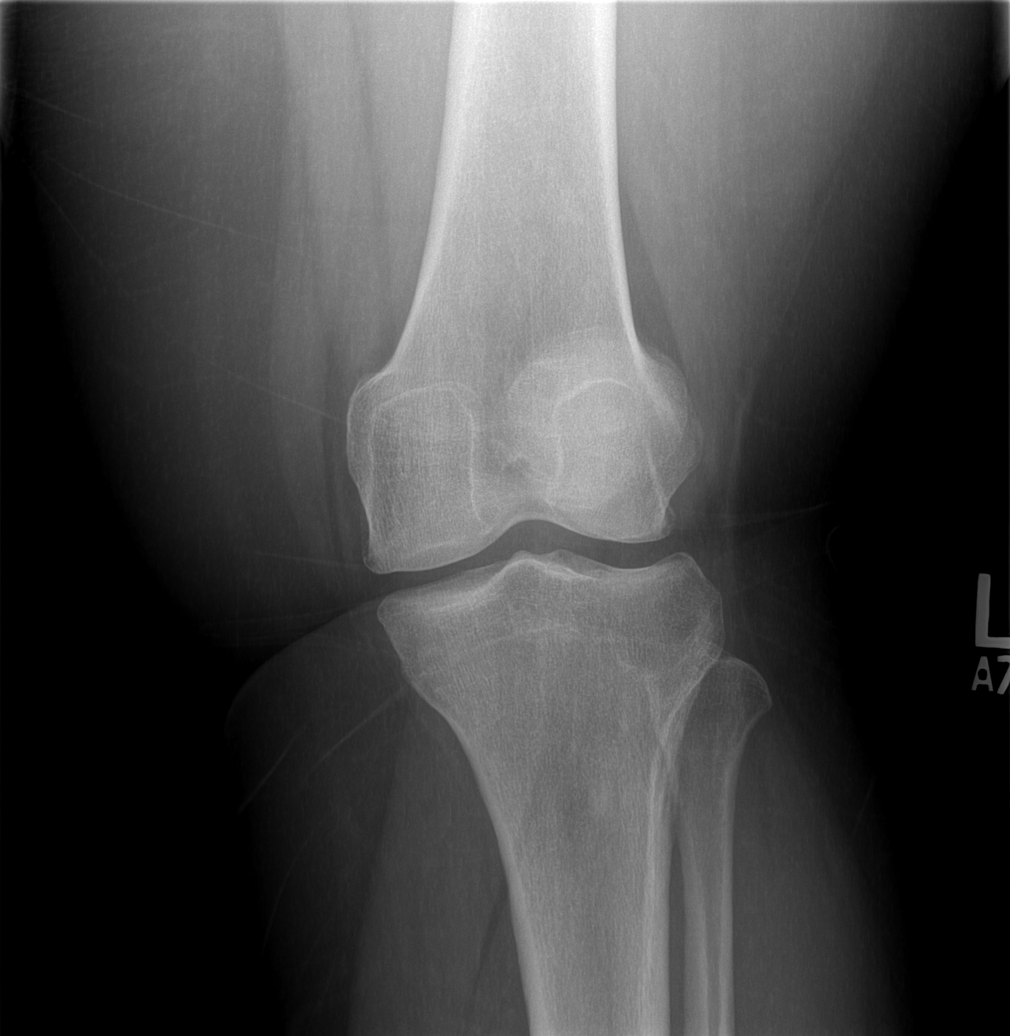

[t knee oblique left (1 of 2)]
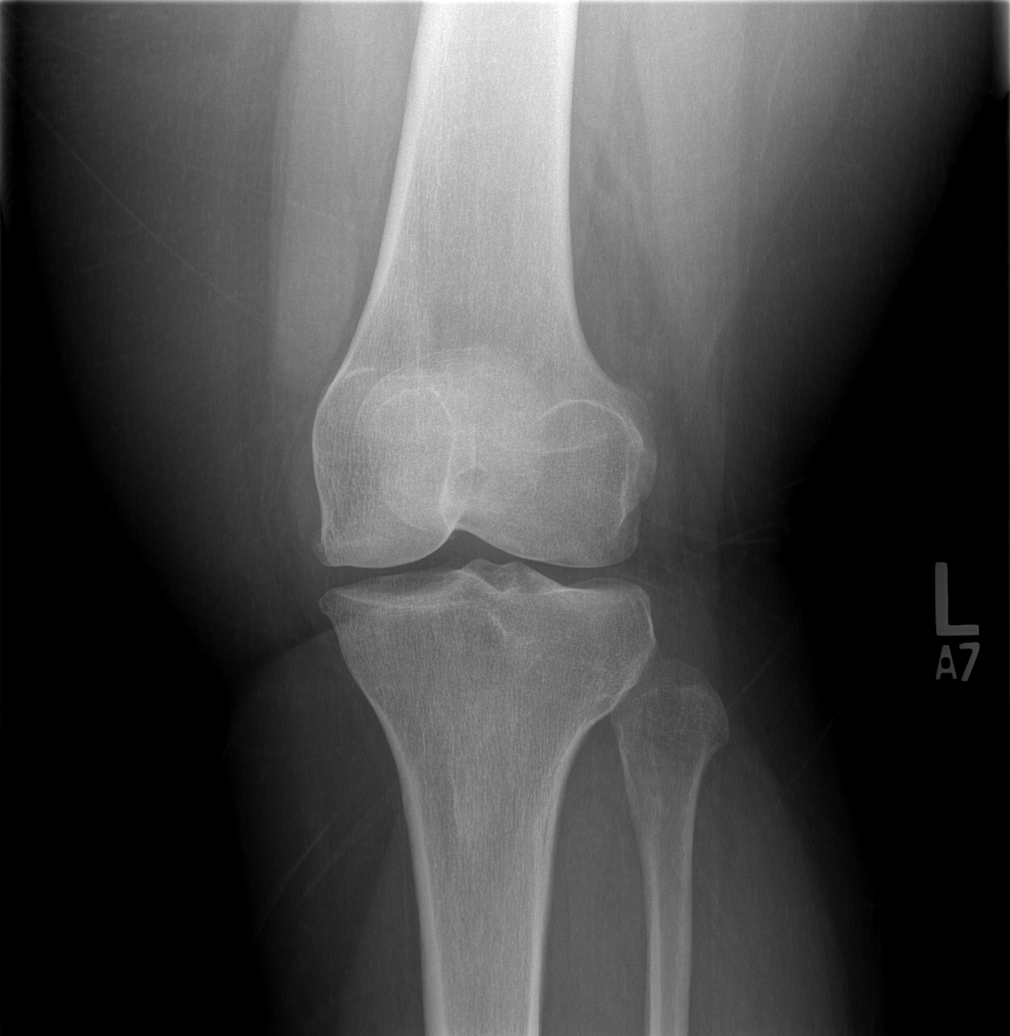

[t knee oblique left (2 of 2)]
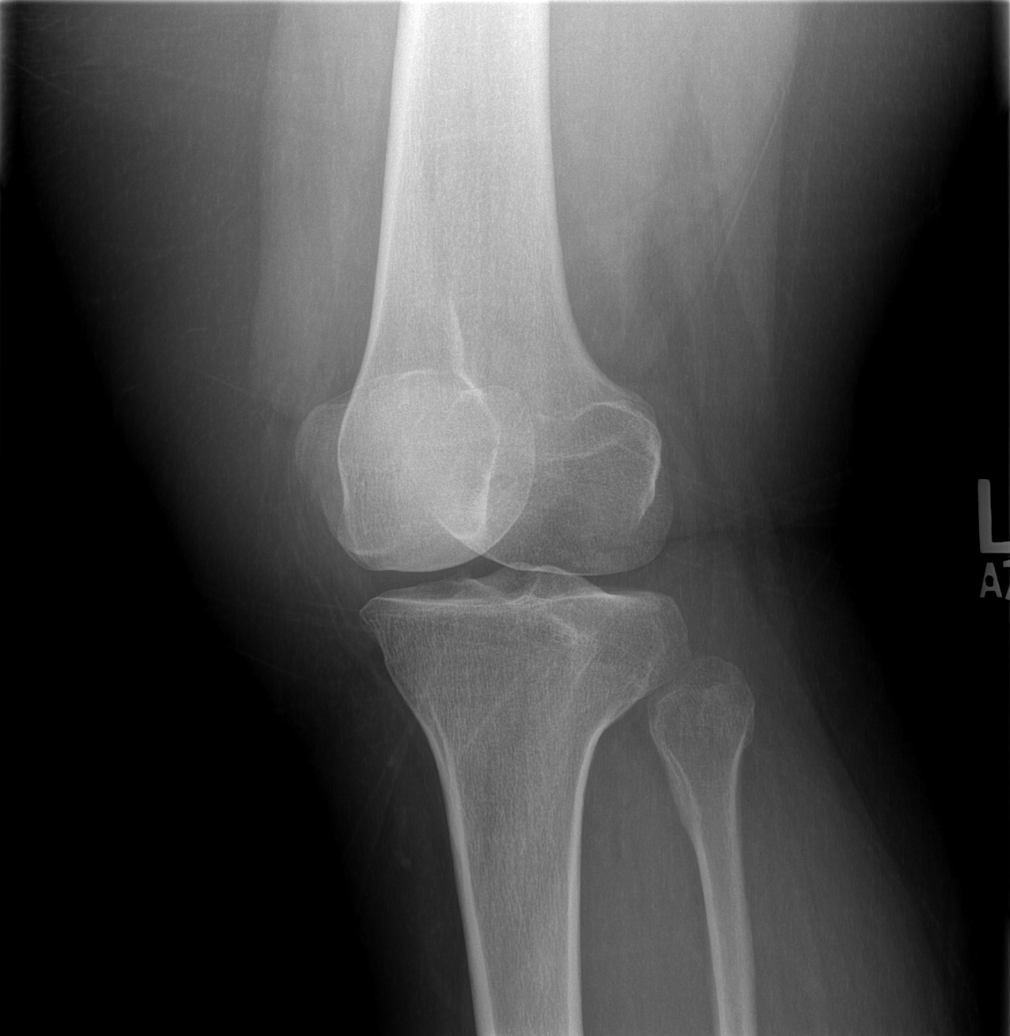

[t knee tunnel view left]
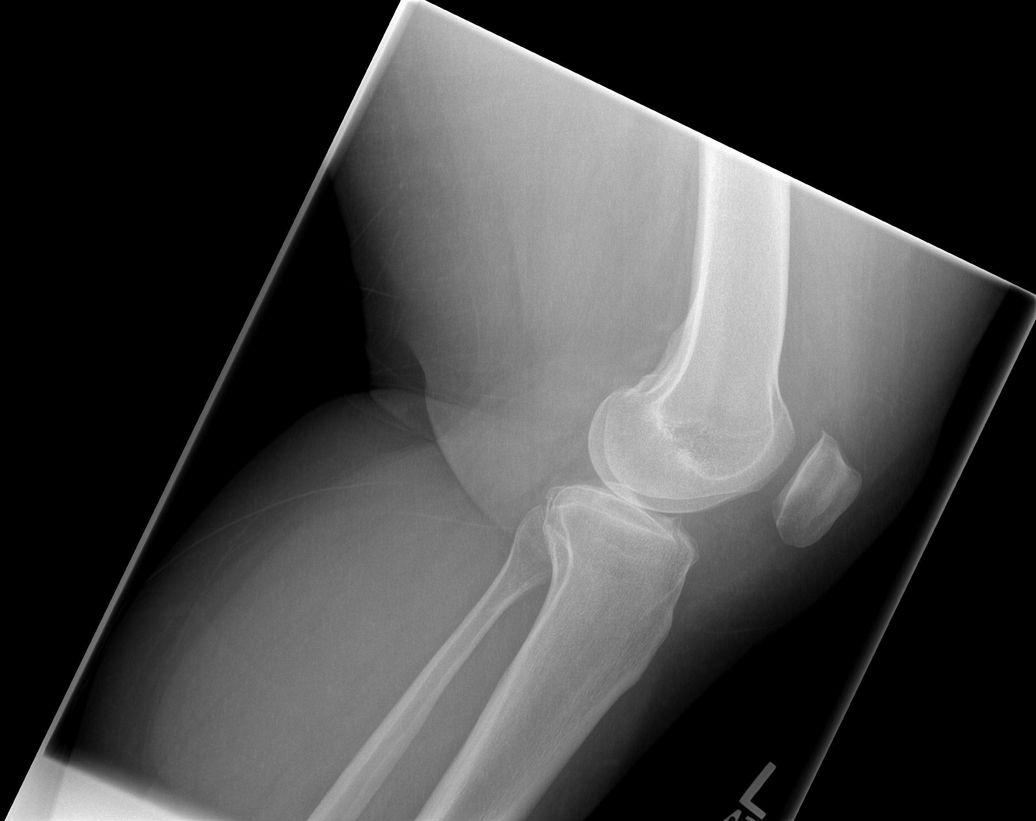

[4 of 4 positions shown; findings below may reference images not displayed]

FINDINGS: The bones are subjectively adequately mineralized. The joint spaces
are well maintained. There is no acute fracture nor dislocation. A
small spur arises from the superior articular margin of the patella.
There is no definite joint effusion.
IMPRESSION: There is no acute or significant chronic bony abnormality of the
left knee. There is minimal degenerative spurring of the superior
articular margin of the patella.

## 2019-07-16 ENCOUNTER — Telehealth: Payer: Self-pay | Admitting: Family Medicine

## 2019-07-16 NOTE — Telephone Encounter (Signed)
Robbin, Can you please go on [facility] and print off patient's Vaccine report? I tried but my printer is not working. She needs this faxed to her attention at fax number [phone]. Please! Thanks!

## 2019-07-17 ENCOUNTER — Telehealth: Payer: Self-pay | Admitting: Internal Medicine

## 2019-07-18 NOTE — Telephone Encounter (Signed)
Done sent to NP °

## 2019-07-18 NOTE — Telephone Encounter (Signed)
Task completed.  Patient informed.
